# Patient Record
Sex: Female | Born: 1960 | Race: White | Hispanic: No | Marital: Married | Smoking: Never smoker
Health system: Southern US, Community
[De-identification: ages and names within clinical notes are randomized; demographics above are authoritative.]

---

## 1991-07-21 HISTORY — PX: OVARIAN CYST REMOVAL: SHX89

## 1999-04-18 ENCOUNTER — Other Ambulatory Visit: Admission: RE | Admit: 1999-04-18 | Discharge: 1999-04-18 | Payer: Self-pay | Admitting: Gynecology

## 2000-04-27 ENCOUNTER — Other Ambulatory Visit: Admission: RE | Admit: 2000-04-27 | Discharge: 2000-04-27 | Payer: Self-pay | Admitting: Gynecology

## 2004-11-11 ENCOUNTER — Ambulatory Visit: Payer: Self-pay | Admitting: Family Medicine

## 2006-01-26 ENCOUNTER — Ambulatory Visit: Payer: Self-pay | Admitting: Family Medicine

## 2007-03-15 ENCOUNTER — Ambulatory Visit: Payer: Self-pay | Admitting: Family Medicine

## 2008-05-24 ENCOUNTER — Ambulatory Visit: Payer: Self-pay | Admitting: Family Medicine

## 2009-07-25 ENCOUNTER — Ambulatory Visit: Payer: Self-pay | Admitting: Family Medicine

## 2010-09-12 LAB — HM PAP SMEAR: HM Pap smear: NORMAL

## 2010-09-30 ENCOUNTER — Inpatient Hospital Stay: Payer: Self-pay | Admitting: General Surgery

## 2010-09-30 HISTORY — PX: APPENDECTOMY: SHX54

## 2010-10-08 ENCOUNTER — Ambulatory Visit: Payer: Self-pay | Admitting: Family Medicine

## 2011-11-17 ENCOUNTER — Ambulatory Visit: Payer: Self-pay | Admitting: Family Medicine

## 2013-04-12 ENCOUNTER — Ambulatory Visit: Payer: Self-pay | Admitting: Family Medicine

## 2013-05-19 LAB — HM COLONOSCOPY

## 2013-07-06 ENCOUNTER — Ambulatory Visit: Payer: Self-pay | Admitting: Family Medicine

## 2013-07-06 LAB — HM MAMMOGRAPHY

## 2015-01-28 ENCOUNTER — Ambulatory Visit (INDEPENDENT_AMBULATORY_CARE_PROVIDER_SITE_OTHER): Payer: BLUE CROSS/BLUE SHIELD | Admitting: Family Medicine

## 2015-01-28 ENCOUNTER — Encounter: Payer: Self-pay | Admitting: Family Medicine

## 2015-01-28 VITALS — BP 102/60 | HR 84 | Temp 98.4°F | Resp 16 | Ht 63.0 in | Wt 111.0 lb

## 2015-01-28 DIAGNOSIS — N390 Urinary tract infection, site not specified: Secondary | ICD-10-CM | POA: Diagnosis not present

## 2015-01-28 DIAGNOSIS — R5383 Other fatigue: Secondary | ICD-10-CM | POA: Insufficient documentation

## 2015-01-28 DIAGNOSIS — J309 Allergic rhinitis, unspecified: Secondary | ICD-10-CM | POA: Insufficient documentation

## 2015-01-28 DIAGNOSIS — B999 Unspecified infectious disease: Secondary | ICD-10-CM | POA: Insufficient documentation

## 2015-01-28 DIAGNOSIS — B373 Candidiasis of vulva and vagina: Secondary | ICD-10-CM | POA: Diagnosis not present

## 2015-01-28 DIAGNOSIS — N12 Tubulo-interstitial nephritis, not specified as acute or chronic: Secondary | ICD-10-CM | POA: Insufficient documentation

## 2015-01-28 DIAGNOSIS — N912 Amenorrhea, unspecified: Secondary | ICD-10-CM | POA: Insufficient documentation

## 2015-01-28 DIAGNOSIS — B3731 Acute candidiasis of vulva and vagina: Secondary | ICD-10-CM

## 2015-01-28 DIAGNOSIS — K5900 Constipation, unspecified: Secondary | ICD-10-CM | POA: Insufficient documentation

## 2015-01-28 DIAGNOSIS — N951 Menopausal and female climacteric states: Secondary | ICD-10-CM | POA: Insufficient documentation

## 2015-01-28 LAB — POCT URINALYSIS DIPSTICK
Bilirubin, UA: NEGATIVE
Glucose, UA: NEGATIVE
Ketones, UA: NEGATIVE
Nitrite, UA: NEGATIVE
PH UA: 5
SPEC GRAV UA: 1.015
UROBILINOGEN UA: 0.2

## 2015-01-28 MED ORDER — FLUCONAZOLE 150 MG PO TABS: 150.0000 mg | ORAL_TABLET | Freq: Once | ORAL | Status: DC

## 2015-01-28 MED ORDER — CIPROFLOXACIN HCL 500 MG PO TABS: 500.0000 mg | ORAL_TABLET | Freq: Two times a day (BID) | ORAL | Status: DC

## 2015-01-28 NOTE — Progress Notes (Signed)
   Subjective:    Patient ID: Brandi Guzman, female    DOB: 08-27-1960, 54 y.o.   MRN: [id number]  Urinary Tract Infection  This is a recurrent (Pt did have UTI symptoms about a month ago, but did improve with OTC medications.) problem. The current episode started in the past 7 days. The problem occurs every urination. The problem has been gradually worsening. The quality of the pain is described as aching and burning. The pain is at a severity of 2/10. The pain is mild. The maximum temperature recorded prior to her arrival was more than 104 F (Pt had a temp close to 105 yesterday.). The fever has been present for less than 1 day. There is no history of pyelonephritis. Associated symptoms include chills, flank pain, frequency, hematuria (Only happened once about a month ago. ), nausea, sweats and urgency. Pertinent negatives include no possible pregnancy or vomiting. She has tried home medications for the symptoms. The treatment provided mild relief.      Review of Systems  Constitutional: Positive for fever, chills, diaphoresis, activity change, appetite change and fatigue. Negative for unexpected weight change.  Gastrointestinal: Positive for nausea. Negative for vomiting, abdominal pain, diarrhea, constipation, blood in stool, abdominal distention, anal bleeding and rectal pain.  Genitourinary: Positive for dysuria, urgency, frequency, hematuria (Only happened once about a month ago. ) and flank pain. Negative for decreased urine volume, vaginal bleeding, vaginal discharge, enuresis, difficulty urinating, genital sores, menstrual problem, pelvic pain and dyspareunia.  Musculoskeletal: Positive for back pain.  Neurological: Positive for headaches. Negative for dizziness and light-headedness.       Objective:   Physical Exam  Constitutional: She appears well-developed and well-nourished. No distress.  Cardiovascular: Normal rate.   Pulmonary/Chest: Breath sounds normal.  Abdominal: She  exhibits no distension and no mass. There is tenderness in the suprapubic area. There is no rebound, no guarding and no CVA tenderness.    BP 102/60 mmHg  Pulse 84  Temp(Src) 98.4 F (36.9 C) (Oral)  Resp 16  Ht 5\' 3"  (1.6 m)  Wt 111 lb (50.349 kg)  BMI 19.67 kg/m2      Assessment & Plan:  1. Lower urinary tract infection Send for culture.  - POCT urinalysis dipstick - Urine Culture  2. Pyelonephritis No CVA tenderness but concerning for pyelonephritis.  Will treat.  Send for culture. Call if worsens or does not improve.  Monitor for C Diff secondary to exposure.  Call if worsens or does not improve.   - ciprofloxacin (CIPRO) 500 MG tablet; Take 1 tablet (500 mg total) by mouth 2 (two) times daily.  Dispense: 20 tablet; Refill: 0 - CBC with Differential/Platelet - Comprehensive metabolic panel  3. Vaginal candida Will treat presumptively .  - fluconazole (DIFLUCAN) 150 MG tablet; Take 1 tablet (150 mg total) by mouth once.  Dispense: 1 tablet; Refill: 0  Lorie PhenixNancy Gera Inboden, MD

## 2015-01-29 ENCOUNTER — Telehealth: Payer: Self-pay

## 2015-01-29 LAB — CBC WITH DIFFERENTIAL/PLATELET
BASOS ABS: 0 10*3/uL (ref 0.0–0.2)
Basos: 0 %
EOS (ABSOLUTE): 0 10*3/uL (ref 0.0–0.4)
EOS: 0 %
HEMATOCRIT: 43.3 % (ref 34.0–46.6)
Hemoglobin: 14.6 g/dL (ref 11.1–15.9)
Immature Grans (Abs): 0 10*3/uL (ref 0.0–0.1)
Immature Granulocytes: 0 %
Lymphocytes Absolute: 2.4 10*3/uL (ref 0.7–3.1)
Lymphs: 16 %
MCH: 31.7 pg (ref 26.6–33.0)
MCHC: 33.7 g/dL (ref 31.5–35.7)
MCV: 94 fL (ref 79–97)
MONOCYTES: 10 %
MONOS ABS: 1.5 10*3/uL — AB (ref 0.1–0.9)
Neutrophils Absolute: 11.6 10*3/uL — ABNORMAL HIGH (ref 1.4–7.0)
Neutrophils: 74 %
Platelets: 181 10*3/uL (ref 150–379)
RBC: 4.6 x10E6/uL (ref 3.77–5.28)
RDW: 13.1 % (ref 12.3–15.4)
WBC: 15.6 10*3/uL — ABNORMAL HIGH (ref 3.4–10.8)

## 2015-01-29 LAB — COMPREHENSIVE METABOLIC PANEL
A/G RATIO: 1.8 (ref 1.1–2.5)
ALT: 72 IU/L — ABNORMAL HIGH (ref 0–32)
AST: 25 IU/L (ref 0–40)
Albumin: 4.2 g/dL (ref 3.5–5.5)
Alkaline Phosphatase: 132 IU/L — ABNORMAL HIGH (ref 39–117)
BUN/Creatinine Ratio: 18 (ref 9–23)
BUN: 17 mg/dL (ref 6–24)
Bilirubin Total: 0.8 mg/dL (ref 0.0–1.2)
CALCIUM: 9.6 mg/dL (ref 8.7–10.2)
CHLORIDE: 98 mmol/L (ref 97–108)
CO2: 23 mmol/L (ref 18–29)
Creatinine, Ser: 0.92 mg/dL (ref 0.57–1.00)
GFR calc Af Amer: 83 mL/min/{1.73_m2} (ref >59)
GFR, EST NON AFRICAN AMERICAN: 72 mL/min/{1.73_m2} (ref >59)
Globulin, Total: 2.4 g/dL (ref 1.5–4.5)
Glucose: 101 mg/dL — ABNORMAL HIGH (ref 65–99)
Potassium: 4.9 mmol/L (ref 3.5–5.2)
Sodium: 141 mmol/L (ref 134–144)
TOTAL PROTEIN: 6.6 g/dL (ref 6.0–8.5)

## 2015-01-29 NOTE — Telephone Encounter (Signed)
-----   Message from Lorie PhenixNancy Maloney, MD sent at 01/29/2015 10:31 AM EDT ----- White count and liver enzymes are elevated, which can happen with infection. Culture will not be back until tomorrow. Please see how patient is going and repeat labs in 2 weeks. Thanks.

## 2015-01-29 NOTE — Telephone Encounter (Signed)
LMTCB 01/29/2015  Thanks,   -Laura  

## 2015-01-30 LAB — URINE CULTURE

## 2015-01-31 ENCOUNTER — Telehealth: Payer: Self-pay

## 2015-01-31 NOTE — Telephone Encounter (Signed)
LMTCB Meira Wahba Drozdowski, CMA  

## 2015-01-31 NOTE — Telephone Encounter (Signed)
Advised pt of lab results. Pt verbally acknowledges understanding. Emily Drozdowski, CMA   

## 2015-01-31 NOTE — Telephone Encounter (Signed)
-----   Message from Lorie PhenixNancy Maloney, MD sent at 01/30/2015  5:44 PM EDT ----- Does have infection. Treated with appropriate antibiotic. Please see how patient is doing. Thanks.

## 2015-06-17 ENCOUNTER — Ambulatory Visit (INDEPENDENT_AMBULATORY_CARE_PROVIDER_SITE_OTHER): Payer: BLUE CROSS/BLUE SHIELD | Admitting: Family Medicine

## 2015-06-17 ENCOUNTER — Encounter: Payer: Self-pay | Admitting: Family Medicine

## 2015-06-17 VITALS — BP 100/64 | HR 62 | Temp 97.9°F | Resp 16 | Ht 63.0 in | Wt 113.0 lb

## 2015-06-17 DIAGNOSIS — Z0000 Encounter for general adult medical examination without abnormal findings: Secondary | ICD-10-CM | POA: Diagnosis not present

## 2015-06-17 DIAGNOSIS — Z124 Encounter for screening for malignant neoplasm of cervix: Secondary | ICD-10-CM | POA: Diagnosis not present

## 2015-06-17 DIAGNOSIS — Z1239 Encounter for other screening for malignant neoplasm of breast: Secondary | ICD-10-CM | POA: Diagnosis not present

## 2015-06-17 DIAGNOSIS — N951 Menopausal and female climacteric states: Secondary | ICD-10-CM

## 2015-06-17 DIAGNOSIS — N952 Postmenopausal atrophic vaginitis: Secondary | ICD-10-CM | POA: Diagnosis not present

## 2015-06-17 DIAGNOSIS — Z780 Asymptomatic menopausal state: Secondary | ICD-10-CM | POA: Diagnosis not present

## 2015-06-17 DIAGNOSIS — Z23 Encounter for immunization: Secondary | ICD-10-CM

## 2015-06-17 LAB — POCT URINALYSIS DIPSTICK
BILIRUBIN UA: NEGATIVE
Blood, UA: NEGATIVE
Glucose, UA: NEGATIVE
Ketones, UA: NEGATIVE
LEUKOCYTES UA: NEGATIVE
PROTEIN UA: NEGATIVE
Spec Grav, UA: 1.01
Urobilinogen, UA: 0.2
pH, UA: 7

## 2015-06-17 MED ORDER — CONJ ESTROG-MEDROXYPROGEST ACE 0.625-2.5 MG PO TABS
1.0000 | ORAL_TABLET | Freq: Every day | ORAL | Status: DC
Start: 2015-06-17 — End: 2016-03-03

## 2015-06-17 NOTE — Patient Instructions (Signed)
Please call the [facility] at [facility] to schedule this at [phone]   

## 2015-06-17 NOTE — Progress Notes (Signed)
Patient ID: Brandi Guzman, female   DOB: [birth year], 54 y.o.   MRN: [id number]        Patient: Brandi Guzman, Female    DOB: [birth year], 54 y.o.   MRN: [id number] Visit Date: 06/17/2015  Today's Provider: Lorie PhenixNancy Rawan Riendeau, MD   Chief Complaint  Patient presents with  . Annual Exam   Subjective:    Annual physical exam Brandi Guzman is a 54 y.o. female who presents today for health maintenance and complete physical. She feels well. She reports exercising 2-3 times a week. She reports she is sleeping well.  03/16/14 CPE 11/02/11 Pap-neg; HPV-neg 07/06/13 Mammo-WNL 05/19/13 Colon-normal 11/02/11 EKG  Lab Results  Component Value Date   WBC 15.6* 01/28/2015   HCT 43.3 01/28/2015   GLUCOSE 101* 01/28/2015   ALT 72* 01/28/2015   AST 25 01/28/2015   NA 141 01/28/2015   K 4.9 01/28/2015   CL 98 01/28/2015   CREATININE 0.92 01/28/2015   BUN 17 01/28/2015   CO2 23 01/28/2015  -------------------------------------------------------------------------------------------------------------------------------------------------------   Review of Systems  Constitutional: Positive for diaphoresis.  HENT: Negative.   Eyes: Negative.   Respiratory: Negative.   Cardiovascular: Negative.   Gastrointestinal: Negative.   Endocrine: Negative.   Genitourinary: Negative.   Musculoskeletal: Negative.   Skin: Negative.   Allergic/Immunologic: Negative.   Neurological: Negative.   Hematological: Negative.   Psychiatric/Behavioral: Negative.     Social History      She  reports that she has never smoked. She has never used smokeless tobacco. She reports that she drinks alcohol. She reports that she does not use illicit drugs.       Social History   Social History  . Marital Status: Married    Spouse Name: Brandi Guzman NeedleMichael  . Number of Children: 2  . Years of Education: College   Occupational History  .      Full-Time   Social History Main Topics  . Smoking status: Never Smoker   .  Smokeless tobacco: Never Used  . Alcohol Use: Yes     Comment: Occasional alcohol use  . Drug Use: No  . Sexual Activity: Not Asked   Other Topics Concern  . None   Social History Narrative    Patient Active Problem List   Diagnosis Date Noted  . Vaginal atrophy 06/17/2015  . Absence of menstruation 01/28/2015  . Constipation 01/28/2015  . 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 Fatigue 01/28/2015  . Lower urinary tract infection 01/28/2015  . Allergic rhinitis 01/28/2015  . Pyelonephritis 01/28/2015    Past Surgical History  Procedure Laterality Date  . Appendectomy  09/30/2010  . Ovarian cyst removal Left 1993    Family History        Family Status  Relation Status Death Age  . Mother Alive   . Father Deceased 1782    Recenlty past away early 2016  . Brother Alive         Her family history includes CAD in her father; COPD in her mother; Diabetes in her mother; Stroke in her mother.    No Known Allergies  Previous Medications   ASPIRIN 81 MG TABLET    Take 1 tablet by mouth daily.   MULTIPLE VITAMIN PO    Take 1 tablet by mouth daily.   PROBIOTIC PRODUCT (PROBIOTIC DAILY PO)    Take 1 capsule by mouth daily.    Patient Care Team: Brandi PhenixNancy Tiya Schrupp, MD as PCP - General (Family Medicine)  Objective:   Vitals: BP 100/64 mmHg  Pulse 62  Temp(Src) 97.9 F (36.6 C) (Oral)  Resp 16  Ht  (1.6 m)  Wt 113 lb (51.256 kg)  BMI 20.02 kg/m2  SpO2 99%   Physical Exam  Constitutional: She is oriented to person, place, and time. She appears well-developed and well-nourished.  HENT:  Head: Normocephalic and atraumatic.  Right Ear: Tympanic membrane, external ear and ear canal normal.  Left Ear: Tympanic membrane, external ear and ear canal normal.  Nose: Nose normal.  Mouth/Throat: Uvula is midline, oropharynx is clear and moist and mucous membranes are normal.  Eyes: Conjunctivae, EOM and lids are normal. Pupils are equal, round, and reactive to light.    Neck: Trachea normal and normal range of motion. Neck supple. Carotid bruit is not present. No thyroid mass and no thyromegaly present.  Cardiovascular: Normal rate, regular rhythm and normal heart sounds.   Pulmonary/Chest: Effort normal and breath sounds normal.  Abdominal: Soft. Normal appearance and bowel sounds are normal. There is no hepatosplenomegaly. There is no tenderness.  Genitourinary: No breast swelling, tenderness or discharge.  Musculoskeletal: Normal range of motion.  Lymphadenopathy:    She has no cervical adenopathy.    She has no axillary adenopathy.  Neurological: She is alert and oriented to person, place, and time. She has normal strength. No cranial nerve deficit.  Skin: Skin is warm, dry and intact.  Psychiatric: She has a normal mood and affect. Her speech is normal and behavior is normal. Judgment and thought content normal. Cognition and memory are normal.     Depression Screen PHQ 2/9 Scores 06/17/2015  Exception Documentation Patient refusal      Assessment & Plan:     Routine Health Maintenance and Physical Exam  Exercise Activities and Dietary recommendations Goals    . Exercise 150 minutes per week (moderate activity)       Immunization History  Administered Date(s) Administered  . Td 05/29/2002  . Tdap 11/02/2011  . Zoster 07/06/2014       1. Annual physical exam Stable. Patient advised to continue eating healthy and exercise daily. - POCT urinalysis dipstick - CBC with Differential/Platelet - Comprehensive metabolic panel - Lipid Panel With LDL/HDL Ratio  2. Breast cancer screening - MM DIGITAL SCREENING BILATERAL; Future  3. Need for influenza vaccination - Flu Vaccine QUAD 36+ mos IM  4. Cervical cancer screening - Pap IG and HPV (high risk) DNA detection  5. Post menopausal syndrome Worsening.  6. Vaginal atrophy Worsening. Patient started on Prempro 0.625-2.5 mg as below. Discussed risks and benefits of medication  and will proceed.  - estrogen, conjugated,-medroxyprogesterone (PREMPRO) 0.625-2.5 MG tablet; Take 1 tablet by mouth daily.  Dispense: 30 tablet; Refill: 5    Patient seen and examined by Dr. Leo Grosser, and note scribed by Liz Beach. Dimas, CMA.  I have reviewed the document for accuracy and completeness and I agree with above. Leo Grosser, MD   Brandi Phenix, MD

## 2015-06-18 ENCOUNTER — Telehealth: Payer: Self-pay

## 2015-06-18 DIAGNOSIS — E875 Hyperkalemia: Secondary | ICD-10-CM

## 2015-06-18 LAB — COMPREHENSIVE METABOLIC PANEL
ALK PHOS: 103 IU/L (ref 39–117)
ALT: 46 IU/L — AB (ref 0–32)
AST: 14 IU/L (ref 0–40)
Albumin/Globulin Ratio: 2 (ref 1.1–2.5)
Albumin: 4.5 g/dL (ref 3.5–5.5)
BUN/Creatinine Ratio: 14 (ref 9–23)
BUN: 11 mg/dL (ref 6–24)
Bilirubin Total: 0.3 mg/dL (ref 0.0–1.2)
CALCIUM: 10.4 mg/dL — AB (ref 8.7–10.2)
CO2: 25 mmol/L (ref 18–29)
CREATININE: 0.78 mg/dL (ref 0.57–1.00)
Chloride: 102 mmol/L (ref 97–106)
GFR calc Af Amer: 101 mL/min/{1.73_m2} (ref >59)
GFR calc non Af Amer: 88 mL/min/{1.73_m2} (ref >59)
GLUCOSE: 94 mg/dL (ref 65–99)
Globulin, Total: 2.2 g/dL (ref 1.5–4.5)
Potassium: 5.5 mmol/L — ABNORMAL HIGH (ref 3.5–5.2)
SODIUM: 146 mmol/L — AB (ref 136–144)
Total Protein: 6.7 g/dL (ref 6.0–8.5)

## 2015-06-18 LAB — LIPID PANEL WITH LDL/HDL RATIO
CHOLESTEROL TOTAL: 204 mg/dL — AB (ref 100–199)
HDL: 82 mg/dL (ref >39)
LDL CALC: 110 mg/dL — AB (ref 0–99)
LDl/HDL Ratio: 1.3 ratio units (ref 0.0–3.2)
TRIGLYCERIDES: 60 mg/dL (ref 0–149)
VLDL CHOLESTEROL CAL: 12 mg/dL (ref 5–40)

## 2015-06-18 LAB — CBC WITH DIFFERENTIAL/PLATELET
BASOS ABS: 0 10*3/uL (ref 0.0–0.2)
Basos: 0 %
EOS (ABSOLUTE): 0.1 10*3/uL (ref 0.0–0.4)
Eos: 2 %
Hematocrit: 43.4 % (ref 34.0–46.6)
Hemoglobin: 14.7 g/dL (ref 11.1–15.9)
IMMATURE GRANS (ABS): 0 10*3/uL (ref 0.0–0.1)
Immature Granulocytes: 0 %
Lymphocytes Absolute: 2.5 10*3/uL (ref 0.7–3.1)
Lymphs: 33 %
MCH: 31.1 pg (ref 26.6–33.0)
MCHC: 33.9 g/dL (ref 31.5–35.7)
MCV: 92 fL (ref 79–97)
MONOS ABS: 0.5 10*3/uL (ref 0.1–0.9)
Monocytes: 7 %
NEUTROS ABS: 4.2 10*3/uL (ref 1.4–7.0)
Neutrophils: 58 %
PLATELETS: 197 10*3/uL (ref 150–379)
RBC: 4.72 x10E6/uL (ref 3.77–5.28)
RDW: 13.2 % (ref 12.3–15.4)
WBC: 7.4 10*3/uL (ref 3.4–10.8)

## 2015-06-18 NOTE — Telephone Encounter (Signed)
Pt advised.  I left a lab sheet at the front desk for her to pick up.   Thanks,   -Vernona RiegerLaura

## 2015-06-18 NOTE — Telephone Encounter (Signed)
-----   Message from Lorie PhenixNancy Maloney, MD sent at 06/18/2015  1:15 PM EST ----- Labs  Stable except another high potassium  Recommend recheck with no charge per Lorel MonacoMark Cheek and leave note copy for Sarah. Thanks.

## 2015-06-22 LAB — COMPREHENSIVE METABOLIC PANEL
ALK PHOS: 106 IU/L (ref 39–117)
ALT: 33 IU/L — ABNORMAL HIGH (ref 0–32)
AST: 13 IU/L (ref 0–40)
Albumin/Globulin Ratio: 1.9 (ref 1.1–2.5)
Albumin: 4.2 g/dL (ref 3.5–5.5)
BUN/Creatinine Ratio: 23 (ref 9–23)
BUN: 17 mg/dL (ref 6–24)
Bilirubin Total: 0.3 mg/dL (ref 0.0–1.2)
CHLORIDE: 97 mmol/L (ref 97–106)
CO2: 26 mmol/L (ref 18–29)
CREATININE: 0.75 mg/dL (ref 0.57–1.00)
Calcium: 10 mg/dL (ref 8.7–10.2)
GFR calc Af Amer: 106 mL/min/{1.73_m2} (ref >59)
GFR calc non Af Amer: 92 mL/min/{1.73_m2} (ref >59)
GLUCOSE: 79 mg/dL (ref 65–99)
Globulin, Total: 2.2 g/dL (ref 1.5–4.5)
Potassium: 4.9 mmol/L (ref 3.5–5.2)
Sodium: 137 mmol/L (ref 136–144)
Total Protein: 6.4 g/dL (ref 6.0–8.5)

## 2015-06-22 LAB — PAP IG AND HPV HIGH-RISK
HPV, high-risk: NEGATIVE
PAP SMEAR COMMENT: 0

## 2015-06-24 ENCOUNTER — Telehealth: Payer: Self-pay

## 2015-06-24 NOTE — Telephone Encounter (Signed)
Advised pt of lab results. Pt verbally acknowledges understanding. [place] Marchiano Drozdowski, CMA   

## 2015-06-24 NOTE — Telephone Encounter (Signed)
Advised pt of lab results. Pt verbally acknowledges understanding. Emily Drozdowski, CMA   

## 2015-06-24 NOTE — Telephone Encounter (Signed)
-----   Message from Lorie PhenixNancy Maloney, MD sent at 06/22/2015  7:59 AM EST ----- Labs stable. Please notify patient. Thanks

## 2015-06-24 NOTE — Telephone Encounter (Signed)
-----   Message from Nancy Maloney, MD sent at 06/23/2015  7:34 AM EST ----- Pap is normal. Please notify patient. 

## 2015-07-03 ENCOUNTER — Ambulatory Visit (INDEPENDENT_AMBULATORY_CARE_PROVIDER_SITE_OTHER): Payer: BLUE CROSS/BLUE SHIELD | Admitting: Family Medicine

## 2015-07-03 ENCOUNTER — Encounter: Payer: Self-pay | Admitting: Family Medicine

## 2015-07-03 VITALS — BP 110/70 | HR 61 | Temp 98.3°F | Resp 16 | Wt 115.0 lb

## 2015-07-03 DIAGNOSIS — J029 Acute pharyngitis, unspecified: Secondary | ICD-10-CM | POA: Diagnosis not present

## 2015-07-03 DIAGNOSIS — J069 Acute upper respiratory infection, unspecified: Secondary | ICD-10-CM

## 2015-07-03 LAB — POC INFLUENZA A&B (BINAX/QUICKVUE)
INFLUENZA A, POC: NEGATIVE
INFLUENZA B, POC: NEGATIVE

## 2015-07-03 LAB — POCT RAPID STREP A (OFFICE): Rapid Strep A Screen: NEGATIVE

## 2015-07-03 NOTE — Patient Instructions (Addendum)
   Gargle with mild warm salt water solution every 2 hours   Take OTC Zinc lozenges (Col-eeze) per directions on label    OTC ibuprofen (Advil) per directions on label for headache and throat pain

## 2015-07-03 NOTE — Progress Notes (Signed)
Patient: Brandi Guzman Female    DOB: [birth year]   54 y.o.   MRN: [id number] Visit Date: 07/03/2015  Today's Provider: Mila Merry, MD   Chief Complaint  Patient presents with  . Sore Throat    x 2 days   Subjective:    Sore Throat  This is a new problem. Episode onset: started 2 days ago. The problem has been unchanged. Neither side of throat is experiencing more pain than the other. There has been no fever. Associated symptoms include headaches (started today), a hoarse voice, a plugged ear sensation and trouble swallowing (painful swallowing). Pertinent negatives include no abdominal pain, congestion, coughing, diarrhea, drooling, ear discharge, ear pain, neck pain, shortness of breath, swollen glands or vomiting. She has had exposure to strep. Exposure to: Cabin [employer]. Treatments tried: Sudafed and Excedrin Migraine. The treatment provided mild relief.  He has also had chills, achiness and bifrontal headache. She has been taking sudafed because she though her sore throat is due to sinus drainage.      No Known Allergies Previous Medications   ASPIRIN 81 MG TABLET    Take 1 tablet by mouth daily.   ESTROGEN, CONJUGATED,-MEDROXYPROGESTERONE (PREMPRO) 0.625-2.5 MG TABLET    Take 1 tablet by mouth daily.   MULTIPLE VITAMIN PO    Take 1 tablet by mouth daily.   PROBIOTIC PRODUCT (PROBIOTIC DAILY PO)    Take 1 capsule by mouth daily.    Review of Systems  Constitutional: Positive for fatigue. Negative for fever, chills, diaphoresis and appetite change.  HENT: Positive for hoarse voice, sneezing, sore throat and trouble swallowing (painful swallowing). Negative for congestion, drooling, ear discharge, ear pain and rhinorrhea.   Eyes: Negative for photophobia, pain, discharge, redness, itching and visual disturbance.  Respiratory: Negative for cough, chest tightness and shortness of breath.   Cardiovascular: Negative for chest pain and palpitations.  Gastrointestinal: Negative  for nausea, vomiting, abdominal pain and diarrhea.  Musculoskeletal: Negative for neck pain.  Neurological: Positive for headaches (started today). Negative for dizziness and weakness.    Social History  Substance Use Topics  . Smoking status: Never Smoker   . Smokeless tobacco: Never Used  . Alcohol Use: Yes     Comment: Occasional alcohol use   Objective:   BP 110/70 mmHg  Pulse 61  Temp(Src) 98.3 F (36.8 C) (Oral)  Resp 16  Wt 115 lb (52.164 kg)  SpO2 98%  Physical Exam  General Appearance:    Alert, cooperative, no distress  HENT:   bilateral TM normal without fluid or infection, neck has bilateral anterior cervical nodes enlarged, pharynx erythematous without exudate, sinuses nontender, post nasal drip noted and nasal mucosa pale and congested  Eyes:    PERRL, conjunctiva/corneas clear, EOM's intact       Lungs:     Clear to auscultation bilaterally, respirations unlabored  Heart:    Regular rate and rhythm  Neurologic:   Awake, alert, oriented x 3. No apparent focal neurological           defect.       Results for orders placed or performed in visit on 07/03/15  POC Influenza A&B  Result Value Ref Range   Influenza A, POC Negative Negative   Influenza B, POC Negative Negative  POCT rapid strep A  Result Value Ref Range   Rapid Strep A Screen Negative Negative        Assessment & Plan:     1.  Sore throat  - POC Influenza A&B - POCT rapid strep A  2. URI (upper respiratory infection) Patient counseled extensively on distinction between viral and bacterial respiratory infections. Advised to call or return for additional evaluation if she develops any sign of bacterial infection, or if current symptoms last longer than 10 days.   Over half of this 20 minute visit were spent in counseling and coordinating care of multiple medical problems.  Patient Instructions   Gargle with mild warm salt water solution every 2 hours   Take OTC Zinc lozenges (Col-eeze)  per directions on label    OTC ibuprofen (Advil) per directions on label for headache and throat pain         Mila Merryonald Linsy Ehresman, MD  [facility]

## 2016-03-02 ENCOUNTER — Encounter: Payer: Self-pay | Admitting: Physician Assistant

## 2016-03-02 ENCOUNTER — Ambulatory Visit (INDEPENDENT_AMBULATORY_CARE_PROVIDER_SITE_OTHER): Payer: BLUE CROSS/BLUE SHIELD | Admitting: Physician Assistant

## 2016-03-02 VITALS — BP 102/70 | HR 60 | Temp 98.2°F | Resp 16 | Wt 110.8 lb

## 2016-03-02 DIAGNOSIS — R5383 Other fatigue: Secondary | ICD-10-CM

## 2016-03-02 DIAGNOSIS — L659 Nonscarring hair loss, unspecified: Secondary | ICD-10-CM | POA: Diagnosis not present

## 2016-03-02 NOTE — Progress Notes (Addendum)
Patient: Brandi Guzman Female    DOB: 01-Jul-1961   55 y.o.   MRN: [id number] Visit Date: 03/02/2016  Today's Provider: Margaretann LovelessJennifer M Burnette, PA-C   Chief Complaint  Patient presents with  . Alopecia   Subjective:    HPI Patient is here with c/o of hair loss since February 2017. She is on Prempo due to previous menopausal symptoms a couple of years back. Symptoms at that time included hair loss, vaginal atrophy/dryness and hot flashes. She reports she being taking Biotin since then as well. When she started on the prempro a couple of years back all symptoms improved. Then beginning in February she has noticed increased hair loss again. All other menopausal symptoms are still controlled. She is now having significant stress about the hair loss which is causing the hair loss to increase. She is wanting labs.     No Known Allergies Current Meds  Medication Sig  . BIOTIN PO Take by mouth.  . estrogen, conjugated,-medroxyprogesterone (PREMPRO) 0.625-2.5 MG tablet Take 1 tablet by mouth daily.  . MULTIPLE VITAMIN PO Take 1 tablet by mouth daily.  . Probiotic Product (PROBIOTIC DAILY PO) Take 1 capsule by mouth daily.    Review of Systems  Constitutional: Positive for fatigue. Negative for chills and fever.  HENT:       Hair loss  Respiratory: Negative for chest tightness and shortness of breath.   Cardiovascular: Negative for chest pain, palpitations and leg swelling.  Gastrointestinal: Negative for abdominal pain, constipation, diarrhea and nausea.  Skin: Negative for rash.  Allergic/Immunologic: Negative for environmental allergies, food allergies and immunocompromised state.  Neurological: Negative for dizziness, weakness and headaches.  Psychiatric/Behavioral: Negative for agitation, dysphoric mood and sleep disturbance. The patient is not nervous/anxious.     Social History  Substance Use Topics  . Smoking status: Never Smoker  . Smokeless tobacco: Never Used  .  Alcohol use Yes     Comment: Occasional alcohol use   Objective:   BP 102/70 (BP Location: Right Arm, Patient Position: Sitting, Cuff Size: Normal)   Pulse 60   Temp 98.2 F (36.8 C) (Oral)   Resp 16   Wt 110 lb 12.8 oz (50.3 kg)   BMI 19.63 kg/m   Physical Exam  Constitutional: She appears well-developed and well-nourished. No distress.  HENT:  Head: Normocephalic and atraumatic. Hair is normal (no patches of hair loss noted. No rash on scalp; did have one strand on clothes that did have root bulb; not much breakage).  Neck: Normal range of motion. Neck supple. No JVD present. No tracheal deviation present. No thyromegaly present.  Cardiovascular: Normal rate, regular rhythm and normal heart sounds.  Exam reveals no gallop and no friction rub.   No murmur heard. Pulmonary/Chest: Effort normal and breath sounds normal. No respiratory distress. She has no wheezes. She has no rales.  Lymphadenopathy:    She has no cervical adenopathy.  Skin: She is not diaphoretic.  Vitals reviewed.     Assessment & Plan:     1. Hair loss Unknown source at this time. Worsened by stress. Will check labs as below and f/u pending lab results. If labs are stable will do trial increase of prempro to see if that helps decrease hair loss. Will see her back in a couple months for her CPE. She is to call if symptoms worsen in the meantime. - CBC with Differential - TSH - Vitamin D (25 hydroxy) - B12 - FSH -  Estradiol - Magnesium - Comprehensive Metabolic Panel (CMET) - Iron - Iron Binding Cap (TIBC)  2. Other fatigue See above medical treatment plan. - CBC with Differential - TSH - Vitamin D (25 hydroxy) - B12 - FSH - Estradiol - Magnesium - Comprehensive Metabolic Panel (CMET) - Iron - Iron Binding Cap (TIBC)  Addend: Labs were WNL. Will increase prempro to 0.625-5mg .      Margaretann LovelessJennifer M Burnette, PA-C  [facility]

## 2016-03-03 LAB — FOLLICLE STIMULATING HORMONE: FSH: 27.4 m[IU]/mL

## 2016-03-03 LAB — IRON AND TIBC
Iron Saturation: 43 % (ref 15–55)
Iron: 135 ug/dL (ref 27–159)
Total Iron Binding Capacity: 317 ug/dL (ref 250–450)
UIBC: 182 ug/dL (ref 131–425)

## 2016-03-03 LAB — COMPREHENSIVE METABOLIC PANEL
A/G RATIO: 1.8 (ref 1.2–2.2)
ALT: 24 IU/L (ref 0–32)
AST: 11 IU/L (ref 0–40)
Albumin: 4.1 g/dL (ref 3.5–5.5)
Alkaline Phosphatase: 60 IU/L (ref 39–117)
BUN / CREAT RATIO: 17 (ref 9–23)
BUN: 14 mg/dL (ref 6–24)
Bilirubin Total: 0.5 mg/dL (ref 0.0–1.2)
CALCIUM: 9.5 mg/dL (ref 8.7–10.2)
CO2: 22 mmol/L (ref 18–29)
Chloride: 100 mmol/L (ref 96–106)
Creatinine, Ser: 0.82 mg/dL (ref 0.57–1.00)
GFR, EST AFRICAN AMERICAN: 94 mL/min/{1.73_m2} (ref >59)
GFR, EST NON AFRICAN AMERICAN: 82 mL/min/{1.73_m2} (ref >59)
GLOBULIN, TOTAL: 2.3 g/dL (ref 1.5–4.5)
Glucose: 80 mg/dL (ref 65–99)
POTASSIUM: 4.2 mmol/L (ref 3.5–5.2)
SODIUM: 139 mmol/L (ref 134–144)
TOTAL PROTEIN: 6.4 g/dL (ref 6.0–8.5)

## 2016-03-03 LAB — CBC WITH DIFFERENTIAL/PLATELET
Basophils Absolute: 0 x10E3/uL (ref 0.0–0.2)
Basos: 0 %
EOS (ABSOLUTE): 0.1 x10E3/uL (ref 0.0–0.4)
Eos: 2 %
Hematocrit: 45.8 % (ref 34.0–46.6)
Hemoglobin: 14.4 g/dL (ref 11.1–15.9)
Immature Grans (Abs): 0 x10E3/uL (ref 0.0–0.1)
Immature Granulocytes: 0 %
Lymphocytes Absolute: 2.1 x10E3/uL (ref 0.7–3.1)
Lymphs: 35 %
MCH: 29.8 pg (ref 26.6–33.0)
MCHC: 31.4 g/dL — ABNORMAL LOW (ref 31.5–35.7)
MCV: 95 fL (ref 79–97)
Monocytes Absolute: 0.4 x10E3/uL (ref 0.1–0.9)
Monocytes: 7 %
Neutrophils Absolute: 3.5 x10E3/uL (ref 1.4–7.0)
Neutrophils: 56 %
Platelets: 189 x10E3/uL (ref 150–379)
RBC: 4.84 x10E6/uL (ref 3.77–5.28)
RDW: 13.1 % (ref 12.3–15.4)
WBC: 6.1 x10E3/uL (ref 3.4–10.8)

## 2016-03-03 LAB — TSH: TSH: 1.42 u[IU]/mL (ref 0.450–4.500)

## 2016-03-03 LAB — MAGNESIUM: MAGNESIUM: 2 mg/dL (ref 1.6–2.3)

## 2016-03-03 LAB — ESTRADIOL: Estradiol: 50.9 pg/mL

## 2016-03-03 LAB — VITAMIN D 25 HYDROXY (VIT D DEFICIENCY, FRACTURES): VIT D 25 HYDROXY: 46.2 ng/mL (ref 30.0–100.0)

## 2016-03-03 LAB — VITAMIN B12: VITAMIN B 12: 540 pg/mL (ref 211–946)

## 2016-03-03 MED ORDER — CONJ ESTROG-MEDROXYPROGEST ACE 0.625-5 MG PO TABS: 1.0000 | ORAL_TABLET | Freq: Every day | ORAL | 1 refills | Status: DC

## 2016-03-03 NOTE — Addendum Note (Signed)
Addended by: Margaretann LovelessBURNETTE, Deoni Cosey M on: 03/03/2016 09:00 AM   Modules accepted: Orders

## 2016-03-13 ENCOUNTER — Ambulatory Visit (INDEPENDENT_AMBULATORY_CARE_PROVIDER_SITE_OTHER): Payer: BLUE CROSS/BLUE SHIELD | Admitting: Podiatry

## 2016-03-13 ENCOUNTER — Encounter: Payer: Self-pay | Admitting: Podiatry

## 2016-03-13 ENCOUNTER — Ambulatory Visit (INDEPENDENT_AMBULATORY_CARE_PROVIDER_SITE_OTHER): Payer: BLUE CROSS/BLUE SHIELD

## 2016-03-13 VITALS — BP 145/77 | HR 54 | Resp 16 | Ht 64.0 in | Wt 110.0 lb

## 2016-03-13 DIAGNOSIS — M21619 Bunion of unspecified foot: Secondary | ICD-10-CM | POA: Diagnosis not present

## 2016-03-13 DIAGNOSIS — M79671 Pain in right foot: Secondary | ICD-10-CM

## 2016-03-13 DIAGNOSIS — M79672 Pain in left foot: Secondary | ICD-10-CM

## 2016-03-13 NOTE — Patient Instructions (Signed)
Bunionectomy A bunionectomy is a surgical procedure to remove a bunion. A bunion is a visible bump of bone on the inside of your foot where your big toe meets the rest of your foot. A bunion can develop when pressure turns this bone (first metatarsal) toward the other toes. Shoes that are too tight are the most common cause of bunions. Bunions can also be caused by diseases, such as arthritis and polio. You may need a bunionectomy if your bunion is very large and painful or it affects your ability to walk. LET YOUR HEALTH CARE PROVIDER KNOW ABOUT:  Any allergies you have.  All medicines you are taking, including vitamins, herbs, eye drops, creams, and over-the-counter medicines.  Previous problems you or members of your family have had with the use of anesthetics.  Any blood disorders you have.  Previous surgeries you have had.  Medical conditions you have. RISKS AND COMPLICATIONS  Generally, this is a safe procedure. However, problems may occur, including:  Infection.  Pain.  Nerve damage.  Bleeding or blood clots.  Reactions to medicines.  Numbness, stiffness, or arthritis in your toe.  Foot problems that continue even after the procedure. BEFORE THE PROCEDURE  Ask your health care provider about:  Changing or stopping your regular medicines. This is especially important if you are taking diabetes medicines or blood thinners.  Taking medicines such as aspirin and ibuprofen. These medicines can thin your blood. Do not take these medicines before your procedure if your health care provider instructs you not to.  Do not drink alcohol before the procedure as directed by your health care provider.  Do not use tobacco products, including cigarettes, chewing tobacco, or electronic cigarettes, before the procedure as directed by your health care provider. If you need help quitting, ask your health care provider.  Ask your health care provider what kind of medicine you will be  given during your procedure. A bunionectomy may be done using one of these:  A medicine that numbs the area (local anesthetic).  A medicine that makes you go to sleep (general anesthetic). If you will be given general anesthetic, do not eat or drink anything after midnight on the night before the procedure or as directed by your health care provider. PROCEDURE  An IV tube may be inserted into a vein.  You will be given local anesthetic or general anesthetic.  The surgeon will make a cut (incision) over the enlarged area at the first joint of the big toe. The surgeon will remove the bunion.  You may have more than one incision if any of the bones in your big toe need to be moved. A bone itself may need to be cut.  Sometimes the tissues around the big toe may also need to be cut then tightened or loosened to reposition the toe.  Screws or other hardware may be used to keep your foot in thecorrect position.  The incision will be closed with stitches (sutures) and covered with adhesive strips or another type of bandage (dressing). AFTER THE PROCEDURE  You may spend some time in a recovery area.  Your blood pressure, heart rate, breathing rate, and blood oxygen level will be monitored often until the medicines you were given have worn off.   This information is not intended to replace advice given to you by your health care provider. Make sure you discuss any questions you have with your health care provider.   Document Released: 06/19/2005 Document Revised: 03/27/2015 Document Reviewed: 02/21/2014   Elsevier Interactive Patient Education 2016 [employer]  

## 2016-03-13 NOTE — Progress Notes (Signed)
   Subjective:    Patient ID: Brandi Guzman, female    DOB: 1961/04/01, 55 y.o.   MRN: [id number]  HPI Chief Complaint  Patient presents with  . Foot Pain    Bilateral; bunions; pt stated, "Bunions do not hurt, but when does cardio workout, bunions start to hurt then; wants to discuss surgery"      Review of Systems  All other systems reviewed and are negative.      Objective:   Physical Exam        Assessment & Plan:

## 2016-03-13 NOTE — Progress Notes (Signed)
Subjective:     Patient ID: Brandi Guzman, female   DOB: 08-25-1960, 55 y.o.   MRN: [id number]  HPI patient presents with significant structural bunion deformity right over left with redness and pain when palpated and states that she's been utilizing wider shoes softer leather but gradually becoming more difficult to accommodate   Review of Systems  All other systems reviewed and are negative.      Objective:   Physical Exam  Constitutional: She is oriented to person, place, and time.  Cardiovascular: Intact distal pulses.   Musculoskeletal: Normal range of motion.  Neurological: She is oriented to person, place, and time.  Skin: Skin is warm.  Nursing note and vitals reviewed.  neurovascular status intact muscle strength adequate range of motion within normal limits with patient noted to have a large hyperostosis medial aspect first metatarsal head right over left with redness and pain when palpated. There is deviation of the right hallux against second toe with mild deviation of the left with no where near as much deformity on the left. 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 I did discuss distal osteotomy and reviewed that with her and she would like to get this done but is not sure on scheduling time. Reviewed her x-rays and she will call to schedule when it is appropriate for her and we'll see her back prior to procedure  X-ray report indicated elevation of the 1 to intermetatarsal angle right with mild deviation of the right hallux against the second toe and minimal deformity on the left

## 2016-03-27 ENCOUNTER — Telehealth: Payer: Self-pay | Admitting: *Deleted

## 2016-03-27 NOTE — Telephone Encounter (Signed)
"  I'd like to see what dates Dr. Charlsie Merlesegal has available for surgery."  Have you signed consent forms?  "No, I just want to get some dates."  Do you have a date in mind that you would like to schedule?  "I'm looking at January 2nd, 2018."  That date is available.  I can put you down tentatively.  Would you like to schedule now for a consultation with Dr. Charlsie Merlesegal?  "Yes, that would be great."  I transferred patient to a scheduler.

## 2016-04-10 ENCOUNTER — Ambulatory Visit (INDEPENDENT_AMBULATORY_CARE_PROVIDER_SITE_OTHER): Payer: BLUE CROSS/BLUE SHIELD | Admitting: Podiatry

## 2016-04-10 DIAGNOSIS — M21619 Bunion of unspecified foot: Secondary | ICD-10-CM

## 2016-04-10 NOTE — Patient Instructions (Signed)
Pre-Operative Instructions  Congratulations, you have decided to take an important step to improving your quality of life.  You can be assured that the doctors of [facility] will be with you every step of the way.  1. Plan to be at the surgery center/hospital at least 1 (one) hour prior to your scheduled time unless otherwise directed by the surgical center/hospital staff.  You must have a responsible adult accompany you, remain during the surgery and drive you home.  Make sure you have directions to the surgical center/hospital and know how to get there on time. 2. For hospital based surgery you will need to obtain a history and physical form from your family physician within 1 month prior to the date of surgery- we will give you a form for you primary physician.  3. We make every effort to accommodate the date you request for surgery.  There are however, times where surgery dates or times have to be moved.  We will contact you as soon as possible if a change in schedule is required.   4. No Aspirin/Ibuprofen for one week before surgery.  If you are on aspirin, any non-steroidal anti-inflammatory medications (Mobic, Aleve, Ibuprofen) you should stop taking it 7 days prior to your surgery.  You make take Tylenol  For pain prior to surgery.  5. Medications- If you are taking daily heart and blood pressure medications, seizure, reflux, allergy, asthma, anxiety, pain or diabetes medications, make sure the surgery center/hospital is aware before the day of surgery so they may notify you which medications to take or avoid the day of surgery. 6. No food or drink after midnight the night before surgery unless directed otherwise by surgical center/hospital staff. 7. No alcoholic beverages 24 hours prior to surgery.  No smoking 24 hours prior to or 24 hours after surgery. 8. Wear loose pants or shorts- loose enough to fit over bandages, boots, and casts. 9. No slip on shoes, sneakers are best. 10. Bring  your boot with you to the surgery center/hospital.  Also bring crutches or a walker if your physician has prescribed it for you.  If you do not have this equipment, it will be provided for you after surgery. 11. If you have not been contracted by the surgery center/hospital by the day before your surgery, call to confirm the date and time of your surgery. 12. Leave-time from work may vary depending on the type of surgery you have.  Appropriate arrangements should be made prior to surgery with your employer. 13. Prescriptions will be provided immediately following surgery by your doctor.  Have these filled as soon as possible after surgery and take the medication as directed. 14. Remove nail polish on the operative foot. 15. Wash the night before surgery.  The night before surgery wash the foot and leg well with the antibacterial soap provided and water paying special attention to beneath the toenails and in between the toes.  Rinse thoroughly with water and dry well with a towel.  Perform this wash unless told not to do so by your physician.  Enclosed: 1 Ice pack (please put in freezer the night before surgery)   1 Hibiclens skin cleaner   Pre-op Instructions  If you have any questions regarding the instructions, do not hesitate to call our office.  [place]: [address] [place], [place] [address] [phone]  [place]: [address] , [place] [address] [phone]  [place]: [address]  [place], [place] [address] [phone]   Dr.   Williard Keller DPM, Dr. Matthew Wagoner DPM, Dr. M. Todd Hyatt DPM, Dr. Titorya Stover DPM 

## 2016-04-13 NOTE — Progress Notes (Signed)
Subjective:     Patient ID: Brandi Guzman, female   DOB: July 24, 1960, 55 y.o.   MRN: [id number]  HPI patient presents with significant structural deformity of the right first metatarsal and right hallux over the left with deviation noted and pain and redness upon palpation   Review of Systems     Objective:   Physical Exam Neurovascular status intact negative Homans sign was noted with patient found have structural malalignment first metatarsal head right over left with deviation the hallux right over left redness pain and inflammation is noted    Assessment:     Structural deformity right over left foot with deviation of the hallux against the second toe    Plan:     H&P and condition reviewed with patient along with x-rays. At this point I have recommended surgical intervention in this particular case and did discuss structural correction with possibility also for Akin-type osteotomy. Patient wants surgery and after extensive review signs consent form and is given all instructions for surgical intervention. She understands no guarantee as far successful surgery and she understands alternative treatments complications as listed and that recovery can take 6 months to one year for complete full recovery. I did dispense air fracture walker with all instructions on usage and she'll be seen back for surgical intervention and is encouraged to call with questions

## 2016-05-13 ENCOUNTER — Other Ambulatory Visit: Payer: Self-pay | Admitting: Physician Assistant

## 2016-05-13 DIAGNOSIS — R5383 Other fatigue: Secondary | ICD-10-CM

## 2016-05-13 DIAGNOSIS — L659 Nonscarring hair loss, unspecified: Secondary | ICD-10-CM

## 2016-06-19 ENCOUNTER — Encounter: Payer: Self-pay | Admitting: Physician Assistant

## 2016-06-19 ENCOUNTER — Ambulatory Visit (INDEPENDENT_AMBULATORY_CARE_PROVIDER_SITE_OTHER): Payer: BLUE CROSS/BLUE SHIELD | Admitting: Physician Assistant

## 2016-06-19 VITALS — BP 118/70 | HR 62 | Temp 97.9°F | Resp 16 | Ht 64.0 in | Wt 118.2 lb

## 2016-06-19 DIAGNOSIS — Z23 Encounter for immunization: Secondary | ICD-10-CM | POA: Diagnosis not present

## 2016-06-19 DIAGNOSIS — Z1239 Encounter for other screening for malignant neoplasm of breast: Secondary | ICD-10-CM

## 2016-06-19 DIAGNOSIS — Z1231 Encounter for screening mammogram for malignant neoplasm of breast: Secondary | ICD-10-CM

## 2016-06-19 DIAGNOSIS — R6889 Other general symptoms and signs: Secondary | ICD-10-CM

## 2016-06-19 DIAGNOSIS — T3695XA Adverse effect of unspecified systemic antibiotic, initial encounter: Secondary | ICD-10-CM

## 2016-06-19 DIAGNOSIS — R5383 Other fatigue: Secondary | ICD-10-CM

## 2016-06-19 DIAGNOSIS — Z1159 Encounter for screening for other viral diseases: Secondary | ICD-10-CM | POA: Diagnosis not present

## 2016-06-19 DIAGNOSIS — L659 Nonscarring hair loss, unspecified: Secondary | ICD-10-CM

## 2016-06-19 DIAGNOSIS — B379 Candidiasis, unspecified: Secondary | ICD-10-CM | POA: Diagnosis not present

## 2016-06-19 DIAGNOSIS — Z0000 Encounter for general adult medical examination without abnormal findings: Secondary | ICD-10-CM | POA: Diagnosis not present

## 2016-06-19 DIAGNOSIS — J069 Acute upper respiratory infection, unspecified: Secondary | ICD-10-CM | POA: Diagnosis not present

## 2016-06-19 MED ORDER — OSELTAMIVIR PHOSPHATE 75 MG PO CAPS: 75.0000 mg | ORAL_CAPSULE | Freq: Two times a day (BID) | ORAL | 0 refills | Status: DC

## 2016-06-19 MED ORDER — CONJ ESTROG-MEDROXYPROGEST ACE 0.625-5 MG PO TABS: 1.0000 | ORAL_TABLET | Freq: Every day | ORAL | 3 refills | Status: DC

## 2016-06-19 MED ORDER — CEFDINIR 300 MG PO CAPS: 300.0000 mg | ORAL_CAPSULE | Freq: Two times a day (BID) | ORAL | 0 refills | Status: DC

## 2016-06-19 MED ORDER — FLUCONAZOLE 150 MG PO TABS: 150.0000 mg | ORAL_TABLET | Freq: Once | ORAL | 0 refills | Status: AC

## 2016-06-19 NOTE — Patient Instructions (Signed)

## 2016-06-19 NOTE — Progress Notes (Signed)
Patient: Brandi Guzman, Female    DOB: April 13, 1961, 55 y.o.   MRN: [id number] Visit Date: 06/19/2016  Today's Provider: Margaretann Loveless, PA-C   Chief Complaint  Patient presents with  . Annual Exam   Subjective:    Annual physical exam Brandi Guzman is a 55 y.o. female who presents today for health maintenance and complete physical. She feels fairly well. She reports exercising. She reports she is sleeping well.  06-17-15 CPE 06/17/15 Pap-neg HPv-neg 07/06/13 BI-RADS 1 05/19/13 Colon-Normal ----------------------------------------------------------------- She is also concern about URI symptoms she has been having for a month. She reports the chest congestion is better. Now she is having sore throat and upper respiratory symptoms for a week and chills. No fever.  Review of Systems  Constitutional: Negative.   HENT: Positive for ear pain, postnasal drip, rhinorrhea, sinus pain, sinus pressure and sore throat.   Eyes: Negative.   Respiratory: Negative.   Cardiovascular: Negative.   Gastrointestinal: Negative.   Endocrine: Negative.   Genitourinary: Negative.   Musculoskeletal: Negative.   Skin: Negative.   Allergic/Immunologic: Negative.   Neurological: Negative.   Hematological: Negative.   Psychiatric/Behavioral: Negative.     Social History      She  reports that she has never smoked. She has never used smokeless tobacco. She reports that she drinks alcohol. She reports that she does not use drugs.       Social History   Social History  . Marital status: Married    Spouse name: Casimiro Needle  . Number of children: 2  . Years of education: College   Occupational History  .      Full-Time   Social History Main Topics  . Smoking status: Never Smoker  . Smokeless tobacco: Never Used  . Alcohol use Yes     Comment: Occasional alcohol use  . Drug use: No  . Sexual activity: Not Asked   Other Topics Concern  . None   Social History Narrative  . None      History reviewed. No pertinent past medical history.   Patient Active Problem List   Diagnosis Date Noted  . Vaginal atrophy 06/17/2015  . Absence of menstruation 01/28/2015  . Constipation 01/28/2015  . 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 Fatigue 01/28/2015  . Lower urinary tract infection 01/28/2015  . Allergic rhinitis 01/28/2015  . Pyelonephritis 01/28/2015    Past Surgical History:  Procedure Laterality Date  . APPENDECTOMY  09/30/2010  . OVARIAN CYST REMOVAL Left 1993    Family History        Family Status  Relation Status  . Mother Alive  . Father Deceased at age 66   Recenlty past away early 2016  . Brother Alive        Her family history includes CAD in her father; COPD in her mother; Diabetes in her mother; Stroke in her mother.     No Known Allergies   Current Outpatient Prescriptions:  Marland Kitchen  MULTIPLE VITAMIN PO, Take 1 tablet by mouth daily., Disp: , Rfl:  .  PREMPRO 0.625-5 MG tablet, take 1 tablet by mouth once daily, Disp: 28 tablet, Rfl: 5 .  Probiotic Product (PROBIOTIC DAILY PO), Take 1 capsule by mouth daily., Disp: , Rfl:  .  aspirin 81 MG tablet, Take 1 tablet by mouth daily., Disp: , Rfl:  .  BIOTIN PO, Take by mouth., Disp: , Rfl:    Patient Care Team: Margaretann Loveless, PA-C as  PCP - General (Family Medicine)      Objective:   Vitals: BP 118/70 (BP Location: Left Arm, Patient Position: Sitting, Cuff Size: Normal)   Pulse 62   Temp 97.9 F (36.6 C) (Oral)   Resp 16   Ht 5\' 4"  (1.626 m)   Wt 118 lb 3.2 oz (53.6 kg)   BMI 20.29 kg/m    Physical Exam  Constitutional: She is oriented to person, place, and time. She appears well-developed and well-nourished. No distress.  HENT:  Head: Normocephalic and atraumatic.  Right Ear: Tympanic membrane, external ear and ear canal normal.  Left Ear: Tympanic membrane, external ear and ear canal normal.  Nose: Nose normal. Right sinus exhibits no maxillary sinus tenderness and no frontal  sinus tenderness. Left sinus exhibits no maxillary sinus tenderness and no frontal sinus tenderness.  Mouth/Throat: Uvula is midline and mucous membranes are normal. Posterior oropharyngeal erythema present. No oropharyngeal exudate or posterior oropharyngeal edema.  Eyes: Conjunctivae and EOM are normal. Pupils are equal, round, and reactive to light. Right eye exhibits no discharge. Left eye exhibits no discharge. No scleral icterus.  Neck: Normal range of motion. Neck supple. No JVD present. No tracheal deviation present. No thyromegaly present.  Cardiovascular: Normal rate, regular rhythm, normal heart sounds and intact distal pulses.  Exam reveals no gallop and no friction rub.   No murmur heard. Pulmonary/Chest: Effort normal and breath sounds normal. No respiratory distress. She has no decreased breath sounds. She has no wheezes. She has no rhonchi. She has no rales. She exhibits no tenderness. Right breast exhibits no inverted nipple, no mass, no nipple discharge, no skin change and no tenderness. Left breast exhibits no inverted nipple, no mass, no nipple discharge, no skin change and no tenderness. Breasts are symmetrical.  Abdominal: Soft. Bowel sounds are normal. She exhibits no distension and no mass. There is no tenderness. There is no rebound and no guarding.  Musculoskeletal: Normal range of motion. She exhibits no edema or tenderness.  Lymphadenopathy:    She has no cervical adenopathy.  Neurological: She is alert and oriented to person, place, and time.  Skin: Skin is warm and dry. No rash noted. She is not diaphoretic.  Psychiatric: She has a normal mood and affect. Her behavior is normal. Judgment and thought content normal.  Vitals reviewed.   Depression Screen PHQ 2/9 Scores 06/17/2015  Exception Documentation Patient refusal    Assessment & Plan:     Routine Health Maintenance and Physical Exam  Exercise Activities and Dietary recommendations Goals    . Exercise  150 minutes per week (moderate activity)       Immunization History  Administered Date(s) Administered  . Influenza,inj,Quad PF,36+ Mos 06/17/2015  . Td 05/29/2002  . Tdap 11/02/2011  . Zoster 07/06/2014    Health Maintenance  Topic Date Due  . Hepatitis C Screening  [birth year]  . HIV Screening  07/19/1977  . MAMMOGRAM  07/07/2015  . INFLUENZA VACCINE  02/18/2016  . PAP SMEAR  06/16/2018  . TETANUS/TDAP  11/01/2021  . COLONOSCOPY  05/20/2023     Discussed health benefits of physical activity, and encouraged her to engage in regular exercise appropriate for her age and condition.    1. Annual physical exam Normal physical exam today. Will check labs as below and f/u pending lab results. If labs are stable and WNL she will not need to have these rechecked for one year at her next annual physical exam. She is to call the  office in the meantime if she has any acute issue, questions or concerns. - CBC with Differential/Platelet - Comprehensive metabolic panel - Hemoglobin A1c - Lipid panel - TSH  2. Breast cancer screening Breast exam today was normal. There is no family history of breast cancer. She does perform regular self breast exams. Mammogram was ordered as below. [facility] was given to patient so she may schedule her mammogram at her convenience. - MM DIGITAL SCREENING BILATERAL; Future  3. Hair loss Stable. Diagnosis pulled for medication refill. Continue current medical treatment plan. - estrogen, conjugated,-medroxyprogesterone (PREMPRO) 0.625-5 MG tablet; Take 1 tablet by mouth daily.  Dispense: 84 tablet; Refill: 3  4. Other fatigue Stable. Diagnosis pulled for medication refill. Continue current medical treatment plan. - estrogen, conjugated,-medroxyprogesterone (PREMPRO) 0.625-5 MG tablet; Take 1 tablet by mouth daily.  Dispense: 84 tablet; Refill: 3  5. Upper respiratory tract infection, unspecified type Worsening symptoms that  have not responded to OTC medications. Will give Omnicef as below. Continue allergy medications. Stay well hydrated and get plenty of rest. Call if no symptom improvement or if symptoms worsen. - cefdinir (OMNICEF) 300 MG capsule; Take 1 capsule (300 mg total) by mouth 2 (two) times daily.  Dispense: 14 capsule; Refill: 0  6. Antibiotic-induced yeast infection Previously had yeast infection with antibiotic use. Will give diflucan as below in case it is needed.  - fluconazole (DIFLUCAN) 150 MG tablet; Take 1 tablet (150 mg total) by mouth once.  Dispense: 1 tablet; Refill: 0  7. Need for hepatitis C screening test - Hepatitis C Antibody  8. Need for influenza vaccination Flu vaccine given today without complication. Patient sat upright for 15 minutes to check for adverse reaction before being released. - Flu Vaccine QUAD 36+ mos PF IM (Fluarix & Fluzone Quad PF)  9. Flu-like symptoms Patient requested Rx for tamiflu to have on hand in case she gets the flu from the vaccination. I explained this was not plausible for her to get the flu from the vaccination because it is a dead virus. She reports she understands but would still like a written Rx just in case because she knows it has to be started very soon after symptom onset and her work schedule does not allow her to be seen in a timely manner.  - oseltamivir (TAMIFLU) 75 MG capsule; Take 1 capsule (75 mg total) by mouth 2 (two) times daily.  Dispense: 10 capsule; Refill: 0  --------------------------------------------------------------------    Margaretann LovelessJennifer M Burnette, PA-C  [facility]

## 2016-06-20 LAB — LIPID PANEL
CHOL/HDL RATIO: 2.5 ratio (ref 0.0–4.4)
Cholesterol, Total: 160 mg/dL (ref 100–199)
HDL: 63 mg/dL (ref >39)
LDL CALC: 82 mg/dL (ref 0–99)
TRIGLYCERIDES: 77 mg/dL (ref 0–149)
VLDL Cholesterol Cal: 15 mg/dL (ref 5–40)

## 2016-06-20 LAB — CBC WITH DIFFERENTIAL/PLATELET
BASOS: 0 %
Basophils Absolute: 0 10*3/uL (ref 0.0–0.2)
EOS (ABSOLUTE): 0.2 10*3/uL (ref 0.0–0.4)
EOS: 3 %
HEMATOCRIT: 43.3 % (ref 34.0–46.6)
Hemoglobin: 14.9 g/dL (ref 11.1–15.9)
IMMATURE GRANS (ABS): 0 10*3/uL (ref 0.0–0.1)
IMMATURE GRANULOCYTES: 1 %
LYMPHS: 28 %
Lymphocytes Absolute: 2.1 10*3/uL (ref 0.7–3.1)
MCH: 32.2 pg (ref 26.6–33.0)
MCHC: 34.4 g/dL (ref 31.5–35.7)
MCV: 94 fL (ref 79–97)
Monocytes Absolute: 0.5 10*3/uL (ref 0.1–0.9)
Monocytes: 6 %
NEUTROS PCT: 62 %
Neutrophils Absolute: 4.8 10*3/uL (ref 1.4–7.0)
PLATELETS: 164 10*3/uL (ref 150–379)
RBC: 4.63 x10E6/uL (ref 3.77–5.28)
RDW: 13.7 % (ref 12.3–15.4)
WBC: 7.6 10*3/uL (ref 3.4–10.8)

## 2016-06-20 LAB — COMPREHENSIVE METABOLIC PANEL
A/G RATIO: 1.8 (ref 1.2–2.2)
ALBUMIN: 4 g/dL (ref 3.5–5.5)
ALK PHOS: 48 IU/L (ref 39–117)
ALT: 21 IU/L (ref 0–32)
AST: 9 IU/L (ref 0–40)
BILIRUBIN TOTAL: 0.4 mg/dL (ref 0.0–1.2)
BUN / CREAT RATIO: 19 (ref 9–23)
BUN: 15 mg/dL (ref 6–24)
CHLORIDE: 97 mmol/L (ref 96–106)
CO2: 24 mmol/L (ref 18–29)
Calcium: 8.9 mg/dL (ref 8.7–10.2)
Creatinine, Ser: 0.79 mg/dL (ref 0.57–1.00)
GFR calc non Af Amer: 86 mL/min/{1.73_m2} (ref >59)
GFR, EST AFRICAN AMERICAN: 99 mL/min/{1.73_m2} (ref >59)
GLUCOSE: 79 mg/dL (ref 65–99)
Globulin, Total: 2.2 g/dL (ref 1.5–4.5)
POTASSIUM: 4 mmol/L (ref 3.5–5.2)
Sodium: 137 mmol/L (ref 134–144)
TOTAL PROTEIN: 6.2 g/dL (ref 6.0–8.5)

## 2016-06-20 LAB — HEMOGLOBIN A1C
Est. average glucose Bld gHb Est-mCnc: 103 mg/dL
HEMOGLOBIN A1C: 5.2 % (ref 4.8–5.6)

## 2016-06-20 LAB — HEPATITIS C ANTIBODY

## 2016-06-20 LAB — TSH: TSH: 1.1 u[IU]/mL (ref 0.450–4.500)

## 2016-06-22 ENCOUNTER — Telehealth: Payer: Self-pay

## 2016-06-22 NOTE — Telephone Encounter (Signed)
LMTCB  Thanks,  -Joseline 

## 2016-06-22 NOTE — Telephone Encounter (Signed)
Patient was advised as directed below.  Thanks,  -Daxen Lanum 

## 2016-06-22 NOTE — Telephone Encounter (Signed)
-----   Message from Margaretann LovelessJennifer M Burnette, [place]PA-C sent at 06/22/2016  9:05 AM EST ----- All labs are within normal limits and stable.  Thanks! -JB

## 2016-07-03 ENCOUNTER — Telehealth: Payer: Self-pay | Admitting: *Deleted

## 2016-07-03 NOTE — Telephone Encounter (Signed)
"  I'm scheduled for a bunionectomy on January 2.  [facility]  I was wondering if it had been scheduled and if so what time will it be.  I'm trying to plan my schedule."

## 2016-07-21 ENCOUNTER — Encounter: Payer: Self-pay | Admitting: Podiatry

## 2016-07-21 DIAGNOSIS — M2011 Hallux valgus (acquired), right foot: Secondary | ICD-10-CM | POA: Diagnosis not present

## 2016-07-21 DIAGNOSIS — M21611 Bunion of right foot: Secondary | ICD-10-CM | POA: Diagnosis not present

## 2016-07-21 DIAGNOSIS — E7800 Pure hypercholesterolemia, unspecified: Secondary | ICD-10-CM | POA: Diagnosis not present

## 2016-07-21 DIAGNOSIS — M205X1 Other deformities of toe(s) (acquired), right foot: Secondary | ICD-10-CM | POA: Diagnosis not present

## 2016-07-22 ENCOUNTER — Telehealth: Payer: Self-pay | Admitting: *Deleted

## 2016-07-22 NOTE — Telephone Encounter (Addendum)
Pt states foot gets numb when elevated above her heart. I told pt to take off the cam walker, open-ended sock, ace wrap and dangle for 15 minutes, this being the only time it was okay to dangle, after 15 minutes then place foot at hip level and rewrap from toes up foot looser, reapply sock and cam walker. I told pt to keep weight bearing to 6615minutes/hour, remain in boot at all times and keep dressing clean and dry. Pt asked when she could wean off the percocet and I told her that she should take as directed at least the 1st week Post op, but can take OTC ibuprofen as package directs in between percocet if she is able to tolerate ibuprofen. Pt states understanding. I told pt to call with concerns.07/29/2016-Pt states was seen in office today and dressing was removed and Dr. Charlsie Merlesegal applied an ace wrap and she is back in the surgical boot. Pt states she got to work and removed the boot and sock and the area near the toes not covered with the ace wrap had bled, is that normal. I spoke with pt and explained that the stiffness of the ace may havevcaused the scabs to be rubbed and disrupted. I instructed the pt to cover the area with a bandaid or dressing to protect from the ace and environment. Pt states understanding.

## 2016-07-29 ENCOUNTER — Ambulatory Visit (INDEPENDENT_AMBULATORY_CARE_PROVIDER_SITE_OTHER): Payer: BLUE CROSS/BLUE SHIELD | Admitting: Podiatry

## 2016-07-29 ENCOUNTER — Ambulatory Visit (INDEPENDENT_AMBULATORY_CARE_PROVIDER_SITE_OTHER): Payer: BLUE CROSS/BLUE SHIELD

## 2016-07-29 VITALS — Temp 98.2°F

## 2016-07-29 DIAGNOSIS — M21619 Bunion of unspecified foot: Secondary | ICD-10-CM

## 2016-07-29 NOTE — Progress Notes (Signed)
Subjective:     Patient ID: Brandi Guzman, female   DOB: February 19, 1961, 56 y.o.   MRN: [id number]  HPI patient states that she's doing great with her right foot with good alignment noted and minimal discomfort or swelling   Review of Systems     Objective:   Physical Exam Neurovascular status intact negative Homans sign was noted with good alignment of the first metatarsal hallux with incision site well healed with incision site well coapted and no indications of skin breakdown    Assessment:     Doing well post metatarsal osteotomy and Akin osteotomy right with good alignment noted and range of motion    Plan:     X-ray reviewed with patient sterile dressing reapplied instructed on continued elevation immobilization compression and reappoint in 2 weeks or earlier if any issues should occur  X-ray report indicates the osteotomy is healing well wires in place with no signs of movement

## 2016-08-13 ENCOUNTER — Ambulatory Visit (INDEPENDENT_AMBULATORY_CARE_PROVIDER_SITE_OTHER): Payer: BLUE CROSS/BLUE SHIELD

## 2016-08-13 ENCOUNTER — Ambulatory Visit (INDEPENDENT_AMBULATORY_CARE_PROVIDER_SITE_OTHER): Payer: BLUE CROSS/BLUE SHIELD | Admitting: Podiatry

## 2016-08-13 DIAGNOSIS — M21619 Bunion of unspecified foot: Secondary | ICD-10-CM

## 2016-08-13 NOTE — Progress Notes (Signed)
Subjective:     Patient ID: Brandi Guzman, female   DOB: 1960/11/02, 56 y.o.   MRN: [id number]  HPI patient states she's doing great with the right foot with patient's structure looking good wound edges well coapted and good alignment noted   Review of Systems     Objective:   Physical Exam Neurovascular status intact negative Homans sign was noted with patient's right foot doing very well with good alignment of the first MPJ and good range of motion    Assessment:     Doing well post osteotomy first metatarsal right    Plan:     Reviewed continued range of motion exercises gradual return to normal shoe gear and reappoint 4 weeks or earlier if needed  X-rays indicate good healing of the osteotomy first metatarsal hallux with pin and wire are intact and good structure noted

## 2016-08-18 ENCOUNTER — Encounter: Payer: Self-pay | Admitting: Podiatry

## 2016-08-25 NOTE — Progress Notes (Signed)
DOS 01.02.2018 [place] bunionectomy (cutting and moving bone) with fixation right. Possible aiken with wire right.

## 2016-09-09 ENCOUNTER — Ambulatory Visit (INDEPENDENT_AMBULATORY_CARE_PROVIDER_SITE_OTHER): Payer: BLUE CROSS/BLUE SHIELD

## 2016-09-09 ENCOUNTER — Ambulatory Visit (INDEPENDENT_AMBULATORY_CARE_PROVIDER_SITE_OTHER): Payer: BLUE CROSS/BLUE SHIELD | Admitting: Podiatry

## 2016-09-09 DIAGNOSIS — M21619 Bunion of unspecified foot: Secondary | ICD-10-CM

## 2016-09-09 NOTE — Progress Notes (Signed)
Subjective:     Patient ID: Brandi Guzman, female   DOB: May 14, 1961, 56 y.o.   MRN: [id number]  HPI patient presents stating that my right foot still doing real well with mild edema and no other problems noted   Review of Systems     Objective:   Physical Exam Neurovascular status negative Homans sign was noted with patient's right foot doing well with good alignment noted good range of motion with no crepitus of the joint surface    Assessment:     Doing well post osteotomy first metatarsal right right hallux    Plan:     H&P x-rays reviewed and discussed continuation of conservative care compression elevation and range of motion exercises. I also discussed long-term orthotics and it next visit we will consider that control chronic tendinitis and foot structure  X-ray indicates pins and wire in place with good alignment of the first metatarsal hallux

## 2016-10-28 ENCOUNTER — Ambulatory Visit: Payer: BLUE CROSS/BLUE SHIELD | Admitting: Podiatry

## 2016-11-04 ENCOUNTER — Ambulatory Visit (INDEPENDENT_AMBULATORY_CARE_PROVIDER_SITE_OTHER): Payer: BLUE CROSS/BLUE SHIELD | Admitting: Podiatry

## 2016-11-04 ENCOUNTER — Ambulatory Visit (INDEPENDENT_AMBULATORY_CARE_PROVIDER_SITE_OTHER): Payer: BLUE CROSS/BLUE SHIELD

## 2016-11-04 ENCOUNTER — Encounter: Payer: Self-pay | Admitting: Podiatry

## 2016-11-04 DIAGNOSIS — M21619 Bunion of unspecified foot: Secondary | ICD-10-CM

## 2016-11-04 NOTE — Progress Notes (Signed)
Subjective:     Patient ID: Brandi Guzman, female   DOB: Oct 10, 1960, 56 y.o.   MRN: [id number]  HPI patient states she's doing well but still getting some swelling in her foot and cannot do everything she wants   Review of Systems     Objective:   Physical Exam Neurovascular status intact with well-healed surgical site right first metatarsal right hallux with good alignment joint rectus. The patient has mild tendinitis-like symptoms due to foot structure    Assessment:     Normal edema after this amount of surgery 3 months after the procedure. Also noted to have mild tendinitis    Plan:     H&P condition reviewed x-rays reviewed. At this point I have recommended continued conservative care and strengthening of the hallux with possibilities long-term for orthotic therapy. Reappoint 8 weeks to recheck  X-ray indicated the osteotomies are healing well with good alignment noted and no signs of pathology

## 2017-01-06 ENCOUNTER — Ambulatory Visit: Payer: BLUE CROSS/BLUE SHIELD | Admitting: Podiatry

## 2017-07-31 ENCOUNTER — Other Ambulatory Visit: Payer: Self-pay | Admitting: Physician Assistant

## 2017-07-31 DIAGNOSIS — L659 Nonscarring hair loss, unspecified: Secondary | ICD-10-CM

## 2017-07-31 DIAGNOSIS — R5383 Other fatigue: Secondary | ICD-10-CM

## 2017-08-17 ENCOUNTER — Ambulatory Visit: Payer: BLUE CROSS/BLUE SHIELD | Admitting: Physician Assistant

## 2017-08-17 ENCOUNTER — Encounter: Payer: Self-pay | Admitting: Physician Assistant

## 2017-08-17 VITALS — BP 110/60 | HR 60 | Temp 98.1°F | Resp 16 | Ht 64.0 in | Wt 115.4 lb

## 2017-08-17 DIAGNOSIS — K644 Residual hemorrhoidal skin tags: Secondary | ICD-10-CM | POA: Diagnosis not present

## 2017-08-17 DIAGNOSIS — Z1231 Encounter for screening mammogram for malignant neoplasm of breast: Secondary | ICD-10-CM | POA: Diagnosis not present

## 2017-08-17 DIAGNOSIS — Z124 Encounter for screening for malignant neoplasm of cervix: Secondary | ICD-10-CM

## 2017-08-17 DIAGNOSIS — Z13820 Encounter for screening for osteoporosis: Secondary | ICD-10-CM | POA: Diagnosis not present

## 2017-08-17 DIAGNOSIS — Z1211 Encounter for screening for malignant neoplasm of colon: Secondary | ICD-10-CM

## 2017-08-17 DIAGNOSIS — Z780 Asymptomatic menopausal state: Secondary | ICD-10-CM

## 2017-08-17 DIAGNOSIS — Z0000 Encounter for general adult medical examination without abnormal findings: Secondary | ICD-10-CM | POA: Diagnosis not present

## 2017-08-17 DIAGNOSIS — Z1239 Encounter for other screening for malignant neoplasm of breast: Secondary | ICD-10-CM

## 2017-08-17 LAB — IFOBT (OCCULT BLOOD): IFOBT: NEGATIVE

## 2017-08-17 NOTE — Patient Instructions (Signed)
Health Maintenance for Postmenopausal Women Menopause is a normal process in which your reproductive ability comes to an end. This process happens gradually over a span of months to years, usually between the ages of 22 and 9. Menopause is complete when you have missed 12 consecutive menstrual periods. It is important to talk with your health care provider about some of the most common conditions that affect postmenopausal women, such as heart disease, cancer, and bone loss (osteoporosis). Adopting a healthy lifestyle and getting preventive care can help to promote your health and wellness. Those actions can also lower your chances of developing some of these common conditions. What should I know about menopause? During menopause, you may experience a number of symptoms, such as:  Moderate-to-severe hot flashes.  Night sweats.  Decrease in sex drive.  Mood swings.  Headaches.  Tiredness.  Irritability.  Memory problems.  Insomnia.  Choosing to treat or not to treat menopausal changes is an individual decision that you make with your health care provider. What should I know about hormone replacement therapy and supplements? Hormone therapy products are effective for treating symptoms that are associated with menopause, such as hot flashes and night sweats. Hormone replacement carries certain risks, especially as you become older. If you are thinking about using estrogen or estrogen with progestin treatments, discuss the benefits and risks with your health care provider. What should I know about heart disease and stroke? Heart disease, heart attack, and stroke become more likely as you age. This may be due, in part, to the hormonal changes that your body experiences during menopause. These can affect how your body processes dietary fats, triglycerides, and cholesterol. Heart attack and stroke are both medical emergencies. There are many things that you can do to help prevent heart disease  and stroke:  Have your blood pressure checked at least every 1-2 years. High blood pressure causes heart disease and increases the risk of stroke.  If you are 53-22 years old, ask your health care provider if you should take aspirin to prevent a heart attack or a stroke.  Do not use any tobacco products, including cigarettes, chewing tobacco, or electronic cigarettes. If you need help quitting, ask your health care provider.  It is important to eat a healthy diet and maintain a healthy weight. ? Be sure to include plenty of vegetables, fruits, low-fat dairy products, and lean protein. ? Avoid eating foods that are high in solid fats, added sugars, or salt (sodium).  Get regular exercise. This is one of the most important things that you can do for your health. ? Try to exercise for at least 150 minutes each week. The type of exercise that you do should increase your heart rate and make you sweat. This is known as moderate-intensity exercise. ? Try to do strengthening exercises at least twice each week. Do these in addition to the moderate-intensity exercise.  Know your numbers.Ask your health care provider to check your cholesterol and your blood glucose. Continue to have your blood tested as directed by your health care provider.  What should I know about cancer screening? There are several types of cancer. Take the following steps to reduce your risk and to catch any cancer development as early as possible. Breast Cancer  Practice breast self-awareness. ? This means understanding how your breasts normally appear and feel. ? It also means doing regular breast self-exams. Let your health care provider know about any changes, no matter how small.  If you are 40  or older, have a clinician do a breast exam (clinical breast exam or CBE) every year. Depending on your age, family history, and medical history, it may be recommended that you also have a yearly breast X-ray (mammogram).  If you  have a family history of breast cancer, talk with your health care provider about genetic screening.  If you are at high risk for breast cancer, talk with your health care provider about having an MRI and a mammogram every year.  Breast cancer (BRCA) gene test is recommended for women who have family members with BRCA-related cancers. Results of the assessment will determine the need for genetic counseling and BRCA1 and for BRCA2 testing. BRCA-related cancers include these types: ? Breast. This occurs in males or females. ? Ovarian. ? Tubal. This may also be called fallopian tube cancer. ? Cancer of the abdominal or pelvic lining (peritoneal cancer). ? Prostate. ? Pancreatic.  Cervical, Uterine, and Ovarian Cancer Your health care provider may recommend that you be screened regularly for cancer of the pelvic organs. These include your ovaries, uterus, and vagina. This screening involves a pelvic exam, which includes checking for microscopic changes to the surface of your cervix (Pap test).  For women ages 21-65, health care providers may recommend a pelvic exam and a Pap test every three years. For women ages 79-65, they may recommend the Pap test and pelvic exam, combined with testing for human papilloma virus (HPV), every five years. Some types of HPV increase your risk of cervical cancer. Testing for HPV may also be done on women of any age who have unclear Pap test results.  Other health care providers may not recommend any screening for nonpregnant women who are considered low risk for pelvic cancer and have no symptoms. Ask your health care provider if a screening pelvic exam is right for you.  If you have had past treatment for cervical cancer or a condition that could lead to cancer, you need Pap tests and screening for cancer for at least 20 years after your treatment. If Pap tests have been discontinued for you, your risk factors (such as having a new sexual partner) need to be  reassessed to determine if you should start having screenings again. Some women have medical problems that increase the chance of getting cervical cancer. In these cases, your health care provider may recommend that you have screening and Pap tests more often.  If you have a family history of uterine cancer or ovarian cancer, talk with your health care provider about genetic screening.  If you have vaginal bleeding after reaching menopause, tell your health care provider.  There are currently no reliable tests available to screen for ovarian cancer.  Lung Cancer Lung cancer screening is recommended for adults 69-62 years old who are at high risk for lung cancer because of a history of smoking. A yearly low-dose CT scan of the lungs is recommended if you:  Currently smoke.  Have a history of at least 30 pack-years of smoking and you currently smoke or have quit within the past 15 years. A pack-year is smoking an average of one pack of cigarettes per day for one year.  Yearly screening should:  Continue until it has been 15 years since you quit.  Stop if you develop a health problem that would prevent you from having lung cancer treatment.  Colorectal Cancer  This type of cancer can be detected and can often be prevented.  Routine colorectal cancer screening usually begins at  age 42 and continues through age 45.  If you have risk factors for colon cancer, your health care provider may recommend that you be screened at an earlier age.  If you have a family history of colorectal cancer, talk with your health care provider about genetic screening.  Your health care provider may also recommend using home test kits to check for hidden blood in your stool.  A small camera at the end of a tube can be used to examine your colon directly (sigmoidoscopy or colonoscopy). This is done to check for the earliest forms of colorectal cancer.  Direct examination of the colon should be repeated every  5-10 years until age 71. However, if early forms of precancerous polyps or small growths are found or if you have a family history or genetic risk for colorectal cancer, you may need to be screened more often.  Skin Cancer  Check your skin from head to toe regularly.  Monitor any moles. Be sure to tell your health care provider: ? About any new moles or changes in moles, especially if there is a change in a mole's shape or color. ? If you have a mole that is larger than the size of a pencil eraser.  If any of your family members has a history of skin cancer, especially at a young age, talk with your health care provider about genetic screening.  Always use sunscreen. Apply sunscreen liberally and repeatedly throughout the day.  Whenever you are outside, protect yourself by wearing long sleeves, pants, a wide-brimmed hat, and sunglasses.  What should I know about osteoporosis? Osteoporosis is a condition in which bone destruction happens more quickly than new bone creation. After menopause, you may be at an increased risk for osteoporosis. To help prevent osteoporosis or the bone fractures that can happen because of osteoporosis, the following is recommended:  If you are 46-71 years old, get at least 1,000 mg of calcium and at least 600 mg of vitamin D per day.  If you are older than age 55 but younger than age 65, get at least 1,200 mg of calcium and at least 600 mg of vitamin D per day.  If you are older than age 54, get at least 1,200 mg of calcium and at least 800 mg of vitamin D per day.  Smoking and excessive alcohol intake increase the risk of osteoporosis. Eat foods that are rich in calcium and vitamin D, and do weight-bearing exercises several times each week as directed by your health care provider. What should I know about how menopause affects my mental health? Depression may occur at any age, but it is more common as you become older. Common symptoms of depression  include:  Low or sad mood.  Changes in sleep patterns.  Changes in appetite or eating patterns.  Feeling an overall lack of motivation or enjoyment of activities that you previously enjoyed.  Frequent crying spells.  Talk with your health care provider if you think that you are experiencing depression. What should I know about immunizations? It is important that you get and maintain your immunizations. These include:  Tetanus, diphtheria, and pertussis (Tdap) booster vaccine.  Influenza every year before the flu season begins.  Pneumonia vaccine.  Shingles vaccine.  Your health care provider may also recommend other immunizations. This information is not intended to replace advice given to you by your health care provider. Make sure you discuss any questions you have with your health care provider. Document Released: 08/28/2005  Document Revised: 01/24/2016 Document Reviewed: 04/09/2015 Elsevier Interactive Patient Education  2018 [employer]  

## 2017-08-17 NOTE — Progress Notes (Signed)
Patient: Brandi Guzman, Female    DOB: Feb 14, 1961, 57 y.o.   MRN: [id number] Visit Date: 08/17/2017  Today's Provider: Margaretann LovelessJennifer M Burnette, PA-C   Chief Complaint  Patient presents with  . Annual Exam   Subjective:    Annual physical exam Brandi Guzman is a 57 y.o. female who presents today for health maintenance and complete physical. She feels well. She reports exercising. She reports she is sleeping well. -----------------------------------------------------------------   Review of Systems  Constitutional: Negative.   HENT: Negative.   Eyes: Negative.   Respiratory: Negative.   Cardiovascular: Negative.   Gastrointestinal: Negative.   Endocrine: Negative.   Genitourinary: Negative.   Musculoskeletal: Negative.   Skin: Negative.   Allergic/Immunologic: Negative.   Neurological: Negative.   Hematological: Negative.   Psychiatric/Behavioral: Negative.     Social History      She  reports that  has never smoked. she has never used smokeless tobacco. She reports that she drinks alcohol. She reports that she does not use drugs.       Social History   Socioeconomic History  . Marital status: Married    Spouse name: Casimiro NeedleMichael  . Number of children: 2  . Years of education: College  . Highest education level: None  Social Needs  . Financial resource strain: None  . Food insecurity - worry: None  . Food insecurity - inability: None  . Transportation needs - medical: None  . Transportation needs - non-medical: None  Occupational History    Comment: Full-Time  Tobacco Use  . Smoking status: Never Smoker  . Smokeless tobacco: Never Used  Substance and Sexual Activity  . Alcohol use: Yes    Comment: Occasional alcohol use  . Drug use: No  . Sexual activity: None  Other Topics Concern  . None  Social History Narrative  . None    History reviewed. No pertinent past medical history.   Patient Active Problem List   Diagnosis Date Noted  . 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 Absence of menstruation 01/28/2015  . Constipation 01/28/2015  . 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 Fatigue 01/28/2015  . Lower urinary tract infection 01/28/2015  . Allergic rhinitis 01/28/2015  . Pyelonephritis 01/28/2015    Past Surgical History:  Procedure Laterality Date  . APPENDECTOMY  09/30/2010  . OVARIAN CYST REMOVAL Left 1993    Family History        Family Status  Relation Name Status  . Mother  Alive  . Father  Deceased at age 56       Recenlty past away early 2016  . Brother  Alive        Her family history includes CAD in her father; COPD in her mother; Diabetes in her mother; Stroke in her mother.     No Known Allergies   Current Outpatient Medications:  .  aspirin 81 MG tablet, Take 1 tablet by mouth daily., Disp: , Rfl:  .  BIOTIN PO, Take by mouth., Disp: , Rfl:  .  MULTIPLE VITAMIN PO, Take 1 tablet by mouth daily., Disp: , Rfl:  .  PREMPRO 0.625-5 MG tablet, take 1 tablet by mouth once daily, Disp: 84 tablet, Rfl: 3 .  Probiotic Product (PROBIOTIC DAILY PO), Take 1 capsule by mouth daily., Disp: , Rfl:    Patient Care Team: Reine JustBurnette, Jennifer M, PA-C as PCP - General (Family Medicine)      Objective:   Vitals: BP 110/60 (BP Location:  Left Arm, Patient Position: Sitting, Cuff Size: Normal)   Pulse 60   Temp 98.1 F (36.7 C) (Oral)   Resp 16   Ht 5\' 4"  (1.626 m)   Wt 115 lb 6.4 oz (52.3 kg)   BMI 19.81 kg/m    Physical Exam  Constitutional: She is oriented to person, place, and time. She appears well-developed and well-nourished. No distress.  HENT:  Head: Normocephalic and atraumatic.  Right Ear: Hearing, tympanic membrane, external ear and ear canal normal.  Left Ear: Hearing, tympanic membrane, external ear and ear canal normal.  Nose: Nose normal.  Mouth/Throat: Uvula is midline, oropharynx is clear and moist and mucous membranes are normal. No oropharyngeal exudate.  Eyes: Conjunctivae and EOM are normal.  Pupils are equal, round, and reactive to light. Right eye exhibits no discharge. Left eye exhibits no discharge. No scleral icterus.  Neck: Normal range of motion. Neck supple. No JVD present. Carotid bruit is not present. No tracheal deviation present. No thyromegaly present.  Cardiovascular: Normal rate, regular rhythm, normal heart sounds and intact distal pulses. Exam reveals no gallop and no friction rub.  No murmur heard. Pulmonary/Chest: Effort normal and breath sounds normal. No respiratory distress. She has no wheezes. She has no rales. She exhibits no tenderness. Right breast exhibits no inverted nipple, no mass, no nipple discharge, no skin change and no tenderness. Left breast exhibits no inverted nipple, no mass, no nipple discharge, no skin change and no tenderness. Breasts are symmetrical.  Abdominal: Soft. Bowel sounds are normal. She exhibits no distension and no mass. There is no tenderness. There is no rebound and no guarding. Hernia confirmed negative in the right inguinal area and confirmed negative in the left inguinal area.  Genitourinary: Rectum normal, vagina normal and uterus normal.    No breast swelling, tenderness, discharge or bleeding. Pelvic exam was performed with patient supine. There is no rash, tenderness, lesion or injury on the right labia. There is no rash, tenderness, lesion or injury on the left labia. Cervix exhibits no motion tenderness, no discharge and no friability. Right adnexum displays no mass, no tenderness and no fullness. Left adnexum displays no mass, no tenderness and no fullness. No erythema, tenderness or bleeding in the vagina. No signs of injury around the vagina. No vaginal discharge found.  Musculoskeletal: Normal range of motion. She exhibits no edema or tenderness.  Lymphadenopathy:    She has no cervical adenopathy.       Right: No inguinal adenopathy present.       Left: No inguinal adenopathy present.  Neurological: She is alert and  oriented to person, place, and time. She has normal reflexes. No cranial nerve deficit. Coordination normal.  Skin: Skin is warm and dry. No rash noted. She is not diaphoretic.  Psychiatric: She has a normal mood and affect. Her behavior is normal. Judgment and thought content normal.  Vitals reviewed.    Depression Screen PHQ 2/9 Scores 08/17/2017 06/17/2015  PHQ - 2 Score 0 -  Exception Documentation - Patient refusal      Assessment & Plan:     Routine Health Maintenance and Physical Exam  Exercise Activities and Dietary recommendations Goals    . Exercise 150 minutes per week (moderate activity)       Immunization History  Administered Date(s) Administered  . Influenza,inj,Quad PF,6+ Mos 06/17/2015, 06/19/2016  . Td 05/29/2002  . Tdap 11/02/2011  . Zoster 07/06/2014    Health Maintenance  Topic Date Due  . HIV  Screening  07/19/1977  . MAMMOGRAM  07/07/2015  . INFLUENZA VACCINE  02/17/2017  . PAP SMEAR  06/16/2018  . TETANUS/TDAP  11/01/2021  . COLONOSCOPY  05/20/2023  . Hepatitis C Screening  Completed     Discussed health benefits of physical activity, and encouraged her to engage in regular exercise appropriate for her age and condition.    1. Annual physical exam Normal physical exam today. Will check labs as below and f/u pending lab results. If labs are stable and WNL she will not need to have these rechecked for one year at her next annual physical exam. She is to call the office in the meantime if she has any acute issue, questions or concerns. - CBC with Differential/Platelet - Comprehensive metabolic panel - Lipid panel - Hemoglobin A1c - TSH  2. Breast cancer screening Breast exam today was normal. There is no family history of breast cancer. She does perform regular self breast exams. Mammogram was ordered as below. Information for [facility] was given to patient so she may schedule her mammogram at her convenience. - MM DIGITAL  SCREENING BILATERAL; Future  3. Cervical cancer screening Pap collected today. Will send as below and f/u pending results. - Pap IG and HPV (high risk) DNA detection  4. Colon cancer screening OC lite collected today and was negative. - IFOBT POC (occult bld, rslt in office)  5. External hemorrhoid Not problematic at this time.   6. Postmenopausal estrogen deficiency No previous bone density. Will get postmenopausal baseline screen as below.  - DG Bone Density; Future  7. Osteoporosis screening See above medical treatment plan. - DG Bone Density; Future  --------------------------------------------------------------------    Margaretann Loveless, PA-C  [facility]

## 2017-08-18 ENCOUNTER — Telehealth: Payer: Self-pay

## 2017-08-18 LAB — COMPREHENSIVE METABOLIC PANEL
ALBUMIN: 4.4 g/dL (ref 3.5–5.5)
ALK PHOS: 51 IU/L (ref 39–117)
ALT: 30 IU/L (ref 0–32)
AST: 12 IU/L (ref 0–40)
Albumin/Globulin Ratio: 1.9 (ref 1.2–2.2)
BUN / CREAT RATIO: 18 (ref 9–23)
BUN: 14 mg/dL (ref 6–24)
Bilirubin Total: 0.5 mg/dL (ref 0.0–1.2)
CO2: 24 mmol/L (ref 20–29)
CREATININE: 0.78 mg/dL (ref 0.57–1.00)
Calcium: 9.5 mg/dL (ref 8.7–10.2)
Chloride: 100 mmol/L (ref 96–106)
GFR calc non Af Amer: 86 mL/min/{1.73_m2} (ref >59)
GFR, EST AFRICAN AMERICAN: 99 mL/min/{1.73_m2} (ref >59)
Globulin, Total: 2.3 g/dL (ref 1.5–4.5)
Glucose: 81 mg/dL (ref 65–99)
Potassium: 3.9 mmol/L (ref 3.5–5.2)
Sodium: 141 mmol/L (ref 134–144)
TOTAL PROTEIN: 6.7 g/dL (ref 6.0–8.5)

## 2017-08-18 LAB — LIPID PANEL
CHOLESTEROL TOTAL: 184 mg/dL (ref 100–199)
Chol/HDL Ratio: 2.5 ratio (ref 0.0–4.4)
HDL: 75 mg/dL (ref >39)
LDL CALC: 99 mg/dL (ref 0–99)
Triglycerides: 52 mg/dL (ref 0–149)
VLDL CHOLESTEROL CAL: 10 mg/dL (ref 5–40)

## 2017-08-18 LAB — HEMOGLOBIN A1C
ESTIMATED AVERAGE GLUCOSE: 105 mg/dL
Hgb A1c MFr Bld: 5.3 % (ref 4.8–5.6)

## 2017-08-18 LAB — CBC WITH DIFFERENTIAL/PLATELET
BASOS ABS: 0 10*3/uL (ref 0.0–0.2)
Basos: 0 %
EOS (ABSOLUTE): 0.1 10*3/uL (ref 0.0–0.4)
Eos: 2 %
HEMOGLOBIN: 13.9 g/dL (ref 11.1–15.9)
Hematocrit: 40.3 % (ref 34.0–46.6)
IMMATURE GRANS (ABS): 0 10*3/uL (ref 0.0–0.1)
Immature Granulocytes: 0 %
LYMPHS: 31 %
Lymphocytes Absolute: 1.8 10*3/uL (ref 0.7–3.1)
MCH: 31.5 pg (ref 26.6–33.0)
MCHC: 34.5 g/dL (ref 31.5–35.7)
MCV: 91 fL (ref 79–97)
Monocytes Absolute: 0.5 10*3/uL (ref 0.1–0.9)
Monocytes: 8 %
Neutrophils Absolute: 3.5 10*3/uL (ref 1.4–7.0)
Neutrophils: 59 %
Platelets: 184 10*3/uL (ref 150–379)
RBC: 4.41 x10E6/uL (ref 3.77–5.28)
RDW: 13.3 % (ref 12.3–15.4)
WBC: 6 10*3/uL (ref 3.4–10.8)

## 2017-08-18 LAB — TSH: TSH: 1.98 u[IU]/mL (ref 0.450–4.500)

## 2017-08-18 NOTE — Telephone Encounter (Signed)
LMTCB  Thanks,  -Joseline 

## 2017-08-18 NOTE — Telephone Encounter (Signed)
Patient advised.KW 

## 2017-08-18 NOTE — Telephone Encounter (Signed)
-----   Message from Margaretann LovelessJennifer M Burnette, PA-C sent at 08/18/2017  8:25 AM EST ----- All labs are within normal limits and stable.  Thanks! -JB

## 2017-08-19 LAB — PAP IG AND HPV HIGH-RISK
HPV, high-risk: NEGATIVE
PAP SMEAR COMMENT: 0

## 2017-09-09 ENCOUNTER — Other Ambulatory Visit: Payer: Self-pay

## 2017-11-18 ENCOUNTER — Ambulatory Visit: Payer: BLUE CROSS/BLUE SHIELD | Admitting: Physician Assistant

## 2017-11-18 ENCOUNTER — Encounter: Payer: Self-pay | Admitting: Physician Assistant

## 2017-11-18 VITALS — BP 130/80 | HR 84 | Temp 98.3°F | Resp 16 | Wt 115.8 lb

## 2017-11-18 DIAGNOSIS — J40 Bronchitis, not specified as acute or chronic: Secondary | ICD-10-CM | POA: Diagnosis not present

## 2017-11-18 MED ORDER — PREDNISONE 10 MG (21) PO TBPK: ORAL_TABLET | ORAL | 0 refills | Status: DC

## 2017-11-18 MED ORDER — ALBUTEROL SULFATE HFA 108 (90 BASE) MCG/ACT IN AERS: 2.0000 | INHALATION_SPRAY | Freq: Four times a day (QID) | RESPIRATORY_TRACT | 0 refills | Status: DC | PRN

## 2017-11-18 MED ORDER — AZITHROMYCIN 250 MG PO TABS: ORAL_TABLET | ORAL | 0 refills | Status: DC

## 2017-11-18 NOTE — Patient Instructions (Signed)

## 2017-11-18 NOTE — Progress Notes (Signed)
Patient: Brandi Guzman Female    DOB: 1961/05/06   57 y.o.   MRN: [id number] Visit Date: 11/18/2017  Today's Provider: Margaretann Loveless, PA-C   Chief Complaint  Patient presents with  . URI   Subjective:    HPI Upper Respiratory Infection: Patient complains of symptoms of a URI. Symptoms include congestion and cough. Onset of symptoms was 10 days ago, gradually worsening since that time. She also c/o bilateral ear pressure/pain, productive cough with  green colored sputum, shortness of breath, sore throat and wheezing for the past 3 days .  She is drinking plenty of fluids. Evaluation to date: none. Treatment to date: antihistamines, cough suppressants and decongestants.     No Known Allergies   Current Outpatient Medications:  .  BIOTIN PO, Take by mouth., Disp: , Rfl:  .  MULTIPLE VITAMIN PO, Take 1 tablet by mouth daily., Disp: , Rfl:  .  PREMPRO 0.625-5 MG tablet, take 1 tablet by mouth once daily, Disp: 84 tablet, Rfl: 3 .  Probiotic Product (PROBIOTIC DAILY PO), Take 1 capsule by mouth daily., Disp: , Rfl:   Review of Systems  Constitutional: Negative.   HENT: Positive for congestion, ear pain, sinus pressure and sore throat.   Respiratory: Positive for cough, shortness of breath and wheezing.   Cardiovascular: Negative.   Neurological: Negative.     Social History   Tobacco Use  . Smoking status: Never Smoker  . Smokeless tobacco: Never Used  Substance Use Topics  . Alcohol use: Yes    Comment: Occasional alcohol use   Objective:   BP 130/80 (BP Location: Left Arm, Patient Position: Sitting, Cuff Size: Normal)   Pulse 84   Temp 98.3 F (36.8 C) (Oral)   Resp 16   Wt 115 lb 12.8 oz (52.5 kg)   SpO2 98%   BMI 19.88 kg/m  Vitals:   11/18/17 1601  BP: 130/80  Pulse: 84  Resp: 16  Temp: 98.3 F (36.8 C)  TempSrc: Oral  SpO2: 98%  Weight: 115 lb 12.8 oz (52.5 kg)     Physical Exam  Constitutional: She appears well-developed and  well-nourished. No distress.  HENT:  Head: Normocephalic and atraumatic.  Right Ear: Hearing, tympanic membrane, external ear and ear canal normal.  Left Ear: Hearing, tympanic membrane, external ear and ear canal normal.  Nose: Nose normal.  Mouth/Throat: Uvula is midline, oropharynx is clear and moist and mucous membranes are normal. No oropharyngeal exudate.  Eyes: Pupils are equal, round, and reactive to light. Conjunctivae are normal. Right eye exhibits no discharge. Left eye exhibits no discharge. No scleral icterus.  Neck: Normal range of motion. Neck supple. No tracheal deviation present. No thyromegaly present.  Cardiovascular: Normal rate, regular rhythm and normal heart sounds. Exam reveals no gallop and no friction rub.  No murmur heard. Pulmonary/Chest: Effort normal. No stridor. No respiratory distress. She has wheezes (fine expiratory). She has no rales.  Lymphadenopathy:    She has no cervical adenopathy.  Skin: Skin is warm and dry. She is not diaphoretic.  Vitals reviewed.       Assessment & Plan:     1. Bronchitis Worsening despite OTC treatment x 10 days. Will give Zpak, prednisone and albuterol inhaler as below. She is to call if symptoms worsen.  - azithromycin (ZITHROMAX) 250 MG tablet; Take 2 tablets PO on day one, and one tablet PO daily thereafter until completed.  Dispense: 6 tablet; Refill: 0 -  predniSONE (STERAPRED UNI-PAK 21 TAB) 10 MG (21) TBPK tablet; 6 day taper; take as directed on package instructions  Dispense: 21 tablet; Refill: 0 - albuterol (PROVENTIL HFA;VENTOLIN HFA) 108 (90 Base) MCG/ACT inhaler; Inhale 2 puffs into the lungs every 6 (six) hours as needed for wheezing or shortness of breath.  Dispense: 1 Inhaler; Refill: 0       Margaretann Loveless, PA-C  [facility]

## 2018-03-22 ENCOUNTER — Ambulatory Visit: Payer: BLUE CROSS/BLUE SHIELD | Admitting: Physician Assistant

## 2018-03-22 ENCOUNTER — Encounter: Payer: Self-pay | Admitting: Physician Assistant

## 2018-03-22 VITALS — BP 110/70 | HR 58 | Temp 98.0°F | Resp 16 | Wt 118.2 lb

## 2018-03-22 DIAGNOSIS — J0140 Acute pansinusitis, unspecified: Secondary | ICD-10-CM

## 2018-03-22 DIAGNOSIS — J3089 Other allergic rhinitis: Secondary | ICD-10-CM

## 2018-03-22 MED ORDER — AZITHROMYCIN 250 MG PO TABS: ORAL_TABLET | ORAL | 0 refills | Status: DC

## 2018-03-22 MED ORDER — FLUTICASONE FUROATE 27.5 MCG/SPRAY NA SUSP: 2.0000 | Freq: Every day | NASAL | 12 refills | Status: DC

## 2018-03-22 NOTE — Progress Notes (Signed)
Patient: Brandi Guzman Female    DOB: 1961/02/10   57 y.o.   MRN: [id number] Visit Date: 03/22/2018  Today's Provider: Margaretann Loveless, PA-C   Chief Complaint  Patient presents with  . URI   Subjective:    URI   This is a new problem. The problem has been unchanged. There has been no fever. Associated symptoms include congestion, a plugged ear sensation and sinus pain. Pertinent negatives include no chest pain, coughing, ear pain, headaches or wheezing. She has tried decongestant ("Periodically" Reports this weekend she took Amox from 2014) for the symptoms.      No Known Allergies   Current Outpatient Medications:  .  BIOTIN PO, Take by mouth., Disp: , Rfl:  .  MULTIPLE VITAMIN PO, Take 1 tablet by mouth daily., Disp: , Rfl:  .  PREMPRO 0.625-5 MG tablet, take 1 tablet by mouth once daily, Disp: 84 tablet, Rfl: 3 .  Probiotic Product (PROBIOTIC DAILY PO), Take 1 capsule by mouth daily., Disp: , Rfl:  .  albuterol (PROVENTIL HFA;VENTOLIN HFA) 108 (90 Base) MCG/ACT inhaler, Inhale 2 puffs into the lungs every 6 (six) hours as needed for wheezing or shortness of breath. (Patient not taking: Reported on 03/22/2018), Disp: 1 Inhaler, Rfl: 0 .  azithromycin (ZITHROMAX) 250 MG tablet, Take 2 tablets PO on day one, and one tablet PO daily thereafter until completed. (Patient not taking: Reported on 03/22/2018), Disp: 6 tablet, Rfl: 0 .  predniSONE (STERAPRED UNI-PAK 21 TAB) 10 MG (21) TBPK tablet, 6 day taper; take as directed on package instructions (Patient not taking: Reported on 03/22/2018), Disp: 21 tablet, Rfl: 0  Review of Systems  Constitutional: Positive for chills. Negative for fever.  HENT: Positive for congestion, postnasal drip, sinus pressure, sinus pain and voice change. Negative for ear pain.   Respiratory: Negative for cough, chest tightness, shortness of breath and wheezing.   Cardiovascular: Negative for chest pain, palpitations and leg swelling.  Gastrointestinal:  Negative.   Neurological: Negative for dizziness and headaches.    Social History   Tobacco Use  . Smoking status: Never Smoker  . Smokeless tobacco: Never Used  Substance Use Topics  . Alcohol use: Yes    Comment: Occasional alcohol use   Objective:   BP 110/70 (BP Location: Left Arm, Patient Position: Sitting, Cuff Size: Normal)   Pulse (!) 58   Temp 98 F (36.7 C) (Oral)   Resp 16   Wt 118 lb 3.2 oz (53.6 kg)   SpO2 97%   BMI 20.29 kg/m  Vitals:   03/22/18 1549  BP: 110/70  Pulse: (!) 58  Resp: 16  Temp: 98 F (36.7 C)  TempSrc: Oral  SpO2: 97%  Weight: 118 lb 3.2 oz (53.6 kg)     Physical Exam  Constitutional: She appears well-developed and well-nourished. No distress.  HENT:  Head: Normocephalic and atraumatic.  Right Ear: Hearing, tympanic membrane, external ear and ear canal normal.  Left Ear: Hearing, tympanic membrane, external ear and ear canal normal.  Nose: Right sinus exhibits maxillary sinus tenderness and frontal sinus tenderness. Left sinus exhibits maxillary sinus tenderness and frontal sinus tenderness.  Mouth/Throat: Uvula is midline, oropharynx is clear and moist and mucous membranes are normal. No oropharyngeal exudate.  Neck: Normal range of motion. Neck supple. No tracheal deviation present. No thyromegaly present.  Cardiovascular: Normal rate, regular rhythm and normal heart sounds. Exam reveals no gallop and no friction rub.  No murmur  heard. Pulmonary/Chest: Effort normal and breath sounds normal. No stridor. No respiratory distress. She has no wheezes. She has no rales.  Lymphadenopathy:    She has no cervical adenopathy.  Skin: She is not diaphoretic.  Vitals reviewed.      Assessment & Plan:     1. Acute pansinusitis, recurrence not specified Worsening symptoms that have not responded to OTC medications. Will give augmentin as below. Continue allergy medications. Stay well hydrated and get plenty of rest. Also advised patient to  NEVER take old antibiotics and to make sure to complete courses when they are given to reduce development of antibiotic resistance. Call if no symptom improvement or if symptoms worsen. - azithromycin (ZITHROMAX) 250 MG tablet; Take 2 tablets PO on day one, and one tablet PO daily thereafter until completed.  Dispense: 6 tablet; Refill: 0  2. Seasonal allergic rhinitis due to fungal spores - fluticasone (FLONASE SENSIMIST) 27.5 MCG/SPRAY nasal spray; Place 2 sprays into the nose daily.  Dispense: 10 g; Refill: 12       Margaretann Loveless, PA-C  [facility]

## 2018-03-26 ENCOUNTER — Ambulatory Visit: Payer: Self-pay | Admitting: Physician Assistant

## 2018-03-29 ENCOUNTER — Encounter: Payer: Self-pay | Admitting: Physician Assistant

## 2018-06-26 ENCOUNTER — Other Ambulatory Visit: Payer: Self-pay | Admitting: Physician Assistant

## 2018-06-26 DIAGNOSIS — R5383 Other fatigue: Secondary | ICD-10-CM

## 2018-06-26 DIAGNOSIS — L659 Nonscarring hair loss, unspecified: Secondary | ICD-10-CM

## 2018-07-07 DIAGNOSIS — H5213 Myopia, bilateral: Secondary | ICD-10-CM | POA: Diagnosis not present

## 2018-09-05 ENCOUNTER — Encounter: Payer: Self-pay | Admitting: Physician Assistant

## 2018-09-05 ENCOUNTER — Ambulatory Visit: Payer: BLUE CROSS/BLUE SHIELD | Admitting: Physician Assistant

## 2018-09-05 VITALS — BP 140/80 | HR 64 | Temp 98.5°F | Resp 16 | Wt 118.4 lb

## 2018-09-05 DIAGNOSIS — J40 Bronchitis, not specified as acute or chronic: Secondary | ICD-10-CM

## 2018-09-05 DIAGNOSIS — J069 Acute upper respiratory infection, unspecified: Secondary | ICD-10-CM | POA: Diagnosis not present

## 2018-09-05 MED ORDER — PREDNISONE 20 MG PO TABS: 20.0000 mg | ORAL_TABLET | Freq: Every day | ORAL | 0 refills | Status: DC

## 2018-09-05 MED ORDER — AZITHROMYCIN 250 MG PO TABS: ORAL_TABLET | ORAL | 0 refills | Status: DC

## 2018-09-05 NOTE — Progress Notes (Signed)
Patient: Brandi Guzman Female    DOB: 03/12/1961   58 y.o.   MRN: [id number] Visit Date: 09/05/2018  Today's Provider: Margaretann Loveless, PA-C   Chief Complaint  Patient presents with  . URI   Subjective:     URI   This is a new problem. The current episode started in the past 7 days (Last Monday started with ache fever chills.). The problem has been gradually worsening. Maximum temperature: Low grade 99.6-100. The fever has been present for 5 days or more. Associated symptoms include congestion, coughing, ear pain, rhinorrhea, a sore throat ("not much" just with the drainage) and wheezing ("some"). Pertinent negatives include no chest pain, headaches or sinus pain. Treatments tried: Mucinex with acetaminophen and Sudafed. The treatment provided no relief.    No Known Allergies   Current Outpatient Medications:  .  BIOTIN PO, Take by mouth., Disp: , Rfl:  .  MULTIPLE VITAMIN PO, Take 1 tablet by mouth daily., Disp: , Rfl:  .  PREMPRO 0.625-5 MG tablet, TAKE 1 TABLET BY MOUTH ONCE DALIY, Disp: 84 tablet, Rfl: 1 .  Probiotic Product (PROBIOTIC DAILY PO), Take 1 capsule by mouth daily., Disp: , Rfl:  .  albuterol (PROVENTIL HFA;VENTOLIN HFA) 108 (90 Base) MCG/ACT inhaler, Inhale 2 puffs into the lungs every 6 (six) hours as needed for wheezing or shortness of breath. (Patient not taking: Reported on 03/22/2018), Disp: 1 Inhaler, Rfl: 0 .  fluticasone (FLONASE SENSIMIST) 27.5 MCG/SPRAY nasal spray, Place 2 sprays into the nose daily. (Patient not taking: Reported on 09/05/2018), Disp: 10 g, Rfl: 12  Review of Systems  Constitutional: Positive for chills and fever.  HENT: Positive for congestion, ear pain, postnasal drip, rhinorrhea, sore throat ("not much" just with the drainage) and voice change. Negative for sinus pressure, sinus pain and trouble swallowing.   Respiratory: Positive for cough and wheezing ("some"). Negative for chest tightness and shortness of breath.     Cardiovascular: Negative for chest pain, palpitations and leg swelling.  Neurological: Negative for headaches.    Social History   Tobacco Use  . Smoking status: Never Smoker  . Smokeless tobacco: Never Used  Substance Use Topics  . Alcohol use: Yes    Comment: Occasional alcohol use      Objective:   BP 140/80 (BP Location: Left Arm, Patient Position: Sitting, Cuff Size: Normal)   Pulse 64   Temp 98.5 F (36.9 C) (Oral)   Resp 16   Wt 118 lb 6.4 oz (53.7 kg)   SpO2 97%   BMI 20.32 kg/m  Vitals:   09/05/18 1047  BP: 140/80  Pulse: 64  Resp: 16  Temp: 98.5 F (36.9 C)  TempSrc: Oral  SpO2: 97%  Weight: 118 lb 6.4 oz (53.7 kg)     Physical Exam Vitals signs reviewed.  Constitutional:      General: She is not in acute distress.    Appearance: Normal appearance. She is well-developed. She is not ill-appearing or diaphoretic.  HENT:     Head: Normocephalic and atraumatic.     Right Ear: Hearing, tympanic membrane, ear canal and external ear normal.     Left Ear: Hearing, tympanic membrane, ear canal and external ear normal.     Nose: Nose normal.     Mouth/Throat:     Mouth: Mucous membranes are moist.     Pharynx: Uvula midline. No oropharyngeal exudate.  Eyes:     General: No scleral icterus.  Right eye: No discharge.        Left eye: No discharge.     Conjunctiva/sclera: Conjunctivae normal.     Pupils: Pupils are equal, round, and reactive to light.  Neck:     Musculoskeletal: Normal range of motion and neck supple.     Thyroid: No thyromegaly.     Trachea: No tracheal deviation.  Cardiovascular:     Rate and Rhythm: Normal rate and regular rhythm.     Heart sounds: Normal heart sounds. No murmur. No friction rub. No gallop.   Pulmonary:     Effort: Pulmonary effort is normal. No respiratory distress.     Breath sounds: No stridor. Examination of the right-upper field reveals wheezing. Examination of the right-lower field reveals wheezing.  Wheezing present. No rales.  Lymphadenopathy:     Cervical: No cervical adenopathy.  Skin:    General: Skin is warm and dry.  Neurological:     Mental Status: She is alert.        Assessment & Plan    1. Upper respiratory tract infection, unspecified type Worsening. Will treat with zpak, prednisone burst. Continue Mucinex DM prn. Use albuterol inhaler prn. Push fluids. Rest. Call if worsening.  - azithromycin (ZITHROMAX) 250 MG tablet; Take 2 tablets PO on day one, and one tablet PO daily thereafter until completed.  Dispense: 6 tablet; Refill: 0 - predniSONE (DELTASONE) 20 MG tablet; Take 1 tablet (20 mg total) by mouth daily with breakfast.  Dispense: 3 tablet; Refill: 0  2. Bronchitis See above medical treatment plan. - azithromycin (ZITHROMAX) 250 MG tablet; Take 2 tablets PO on day one, and one tablet PO daily thereafter until completed.  Dispense: 6 tablet; Refill: 0 - predniSONE (DELTASONE) 20 MG tablet; Take 1 tablet (20 mg total) by mouth daily with breakfast.  Dispense: 3 tablet; Refill: 0     Margaretann Loveless, PA-C  [facility]

## 2018-09-05 NOTE — Patient Instructions (Signed)
Acute Bronchitis, Adult Acute bronchitis is when air tubes (bronchi) in the lungs suddenly get swollen. The condition can make it hard to breathe. It can also cause these symptoms:  A cough.  Coughing up clear, yellow, or green mucus.  Wheezing.  Chest congestion.  Shortness of breath.  A fever.  Body aches.  Chills.  A sore throat. Follow these instructions at home:  Medicines  Take over-the-counter and prescription medicines only as told by your doctor.  If you were prescribed an antibiotic medicine, take it as told by your doctor. Do not stop taking the antibiotic even if you start to feel better. General instructions  Rest.  Drink enough fluids to keep your pee (urine) pale yellow.  Avoid smoking and secondhand smoke. If you smoke and you need help quitting, ask your doctor. Quitting will help your lungs heal faster.  Use an inhaler, cool mist vaporizer, or humidifier as told by your doctor.  Keep all follow-up visits as told by your doctor. This is important. How is this prevented? To lower your risk of getting this condition again:  Wash your hands often with soap and water. If you cannot use soap and water, use hand sanitizer.  Avoid contact with people who have cold symptoms.  Try not to touch your hands to your mouth, nose, or eyes.  Make sure to get the flu shot every year. Contact a doctor if:  Your symptoms do not get better in 2 weeks. Get help right away if:  You cough up blood.  You have chest pain.  You have very bad shortness of breath.  You become dehydrated.  You faint (pass out) or keep feeling like you are going to pass out.  You keep throwing up (vomiting).  You have a very bad headache.  Your fever or chills gets worse. This information is not intended to replace advice given to you by your health care provider. Make sure you discuss any questions you have with your health care provider. Document Released: 12/23/2007 Document  Revised: 02/17/2017 Document Reviewed: 12/25/2015 Elsevier Interactive Patient Education  2019 [employer]  

## 2019-01-16 ENCOUNTER — Other Ambulatory Visit: Payer: Self-pay | Admitting: Physician Assistant

## 2019-01-16 DIAGNOSIS — R5383 Other fatigue: Secondary | ICD-10-CM

## 2019-01-16 DIAGNOSIS — L659 Nonscarring hair loss, unspecified: Secondary | ICD-10-CM

## 2019-01-16 NOTE — Telephone Encounter (Signed)
Please advise refill? 

## 2019-03-22 ENCOUNTER — Other Ambulatory Visit: Payer: Self-pay

## 2019-03-22 DIAGNOSIS — R6889 Other general symptoms and signs: Secondary | ICD-10-CM | POA: Diagnosis not present

## 2019-03-22 DIAGNOSIS — Z20822 Contact with and (suspected) exposure to covid-19: Secondary | ICD-10-CM

## 2019-03-23 LAB — NOVEL CORONAVIRUS, NAA: SARS-CoV-2, NAA: NOT DETECTED

## 2019-07-03 ENCOUNTER — Other Ambulatory Visit: Payer: Self-pay | Admitting: Physician Assistant

## 2019-07-03 DIAGNOSIS — L659 Nonscarring hair loss, unspecified: Secondary | ICD-10-CM

## 2019-07-03 DIAGNOSIS — R5383 Other fatigue: Secondary | ICD-10-CM

## 2019-07-03 NOTE — Telephone Encounter (Signed)
Due for CPE

## 2019-07-03 NOTE — Telephone Encounter (Signed)
Requested medication (s) are due for refill today: yes  Requested medication (s) are on the active medication list: yes  Last refill:  04/06/2019  Future visit scheduled: no  Notes to clinic:  LOV- 09/05/2018 Review for refill   Requested Prescriptions  Pending Prescriptions Disp Refills   PREMPRO 0.625-5 MG tablet [Pharmacy Med Name: PREMPRO 0.625MG /5MG  TABS 28 (BLUE)] 84 tablet 1    Sig: TAKE 1 TABLET BY MOUTH ONCE DALIY      OB/GYN:  Hormone Combinations Failed - 07/03/2019  3:30 AM      Failed - Mammogram is up-to-date per Health Maintenance      Failed - Last BP in normal range    BP Readings from Last 1 Encounters:  09/05/18 140/80          Passed - Valid encounter within last 12 months    Recent Outpatient Visits           10 months ago Upper respiratory tract infection, unspecified type   [place], [place], [place]   1 year ago Acute pansinusitis, recurrence not specified   [place], [place], [place]   1 year ago [place], [place], [place]   1 year ago Annual physical exam   [facility], [place], [place]   3 years ago Annual physical exam   [facility] [place], [place], [place]

## 2019-07-04 NOTE — Telephone Encounter (Signed)
LMTCB OK for [facility] to schedule Physical

## 2019-09-23 ENCOUNTER — Other Ambulatory Visit: Payer: Self-pay | Admitting: Physician Assistant

## 2019-09-23 DIAGNOSIS — L659 Nonscarring hair loss, unspecified: Secondary | ICD-10-CM

## 2019-09-23 DIAGNOSIS — R5383 Other fatigue: Secondary | ICD-10-CM

## 2019-09-23 NOTE — Telephone Encounter (Signed)
Requested medications are due for refill today? Yes  Requested medications are on active medication list? Yes  Last Refill:   07/03/2019  # 84 with no refills - Patient was overdue for physical when this refill was provided.    Future visit scheduled?  NO  Notes to Clinic:  Protocol failed, as patient is overdue for physical and mammogram.

## 2019-09-25 NOTE — Telephone Encounter (Signed)
Needs CPE 

## 2019-09-26 NOTE — Telephone Encounter (Signed)
LMTCB 09/26/2019.  PEC please schedule CPE when pt calls back.   Thanks,   -Vernona Rieger

## 2020-10-09 NOTE — Progress Notes (Signed)
Complete physical exam   Patient: Brandi Guzman   DOB: 1961-01-17   60 y.o. Female  MRN: [id number] Visit Date: 10/10/2020  Today's healthcare provider: Margaretann Loveless, PA-C   Chief Complaint  Patient presents with  . Annual Exam   Subjective    Brandi Guzman is a 60 y.o. female who presents today for a complete physical exam.  She reports consuming a general diet. The patient does not participate in regular exercise at present. She generally feels well. She reports sleeping well. She does not have additional problems to discuss today.   Last Pap: 08/17/2017 Normal/ HPV negative Last Mammogram: 07/06/2013 Bi-RADS CAT 1 OC light: 08/17/2017 negative  HPI   No past medical history on file. Past Surgical History:  Procedure Laterality Date  . APPENDECTOMY  09/30/2010  . OVARIAN CYST REMOVAL Left 1993   Social History   Socioeconomic History  . Marital status: Married    Spouse name: Casimiro Needle  . Number of children: 2  . Years of education: College  . Highest education level: Not on file  Occupational History    Comment: Full-Time  Tobacco Use  . Smoking status: Never Smoker  . Smokeless tobacco: Never Used  Substance and Sexual Activity  . Alcohol use: Yes    Comment: Occasional alcohol use  . Drug use: No  . Sexual activity: Not on file  Other Topics Concern  . Not on file  Social History Narrative  . Not on file   Social Determinants of Health   Financial Resource Strain: Not on file  Food Insecurity: Not on file  Transportation Needs: Not on file  Physical Activity: Not on file  Stress: Not on file  Social Connections: Not on file  Intimate Partner Violence: Not on file   Family Status  Relation Name Status  . Mother  Alive  . Father  Deceased at age 62       Recenlty past away early 2016  . Brother  Alive   Family History  Problem Relation Age of Onset  . Diabetes Mother        DM; Type 2  . COPD Mother   . Stroke Mother   . CAD  Father    No Known Allergies  Patient Care Team: Reine Just as PCP - General (Family Medicine)   Medications: Outpatient Medications Prior to Visit  Medication Sig  . BIOTIN PO Take by mouth.  . MULTIPLE VITAMIN PO Take 1 tablet by mouth daily.  . Probiotic Product (PROBIOTIC DAILY PO) Take 1 capsule by mouth daily.  . [DISCONTINUED] albuterol (PROVENTIL HFA;VENTOLIN HFA) 108 (90 Base) MCG/ACT inhaler Inhale 2 puffs into the lungs every 6 (six) hours as needed for wheezing or shortness of breath. (Patient not taking: No sig reported)  . [DISCONTINUED] azithromycin (ZITHROMAX) 250 MG tablet Take 2 tablets PO on day one, and one tablet PO daily thereafter until completed. (Patient not taking: Reported on 10/10/2020)  . [DISCONTINUED] fluticasone (FLONASE SENSIMIST) 27.5 MCG/SPRAY nasal spray Place 2 sprays into the nose daily. (Patient not taking: No sig reported)  . [DISCONTINUED] predniSONE (DELTASONE) 20 MG tablet Take 1 tablet (20 mg total) by mouth daily with breakfast. (Patient not taking: Reported on 10/10/2020)  . [DISCONTINUED] PREMPRO 0.625-5 MG tablet TAKE 1 TABLET BY MOUTH ONCE DALIY (Patient not taking: Reported on 10/10/2020)   No facility-administered medications prior to visit.    Review of Systems  Constitutional: Negative for chills, fatigue  and fever.  HENT: Negative for congestion, ear pain, rhinorrhea, sneezing and sore throat.   Eyes: Negative.  Negative for pain and redness.  Respiratory: Negative for cough, shortness of breath and wheezing.   Cardiovascular: Negative for chest pain and leg swelling.  Gastrointestinal: Negative for abdominal pain, blood in stool, constipation, diarrhea and nausea.  Endocrine: Negative for polydipsia and polyphagia.  Genitourinary: Negative.  Negative for dysuria, flank pain, hematuria, pelvic pain, vaginal bleeding and vaginal discharge.  Musculoskeletal: Negative for arthralgias, back pain, gait problem and joint  swelling.  Skin: Negative for rash.  Neurological: Negative.  Negative for dizziness, tremors, seizures, weakness, light-headedness, numbness and headaches.  Hematological: Negative for adenopathy.  Psychiatric/Behavioral: Negative.  Negative for behavioral problems, confusion and dysphoric mood. The patient is not nervous/anxious and is not hyperactive.     Last CBC Lab Results  Component Value Date   WBC 5.2 10/10/2020   HGB 14.3 10/10/2020   HCT 43.2 10/10/2020   MCV 91 10/10/2020   MCH 30.2 10/10/2020   RDW 11.8 10/10/2020   PLT 184 10/10/2020   Last metabolic panel Lab Results  Component Value Date   GLUCOSE 89 10/10/2020   NA 145 (H) 10/10/2020   K 5.2 10/10/2020   CL 104 10/10/2020   CO2 24 10/10/2020   BUN 16 10/10/2020   CREATININE 0.81 10/10/2020   GFRNONAA 86 08/17/2017   GFRAA 99 08/17/2017   CALCIUM 10.1 10/10/2020   PROT 6.9 10/10/2020   ALBUMIN 4.3 10/10/2020   LABGLOB 2.6 10/10/2020   AGRATIO 1.7 10/10/2020   BILITOT 0.3 10/10/2020   ALKPHOS 92 10/10/2020   AST 12 10/10/2020   ALT 24 10/10/2020      Objective    BP 108/71 (BP Location: Left Arm)   Pulse 68   Temp 97.7 F (36.5 C) (Temporal)   Resp 14   Ht 5\' 4"  (1.626 m)   Wt 120 lb 6.4 oz (54.6 kg)   BMI 20.67 kg/m  BP Readings from Last 3 Encounters:  10/10/20 108/71  09/05/18 140/80  03/22/18 110/70   Wt Readings from Last 3 Encounters:  10/10/20 120 lb 6.4 oz (54.6 kg)  09/05/18 118 lb 6.4 oz (53.7 kg)  03/22/18 118 lb 3.2 oz (53.6 kg)     Physical Exam Vitals reviewed.  Constitutional:      General: She is not in acute distress.    Appearance: Normal appearance. She is well-developed and normal weight. She is not ill-appearing or diaphoretic.  HENT:     Head: Normocephalic and atraumatic.     Right Ear: Hearing, tympanic membrane, ear canal and external ear normal.     Left Ear: Hearing, tympanic membrane, ear canal and external ear normal.     Nose: Nose normal.      Mouth/Throat:     Mouth: Mucous membranes are moist.     Pharynx: Oropharynx is clear. Uvula midline. No oropharyngeal exudate.  Eyes:     General: No scleral icterus.       Right eye: No discharge.        Left eye: No discharge.     Extraocular Movements: Extraocular movements intact.     Conjunctiva/sclera: Conjunctivae normal.     Pupils: Pupils are equal, round, and reactive to light.  Neck:     Thyroid: No thyromegaly.     Vascular: No carotid bruit or JVD.     Trachea: No tracheal deviation.  Cardiovascular:     Rate and Rhythm: Normal  rate and regular rhythm.     Pulses: Normal pulses.     Heart sounds: Normal heart sounds. No murmur heard. No friction rub. No gallop.   Pulmonary:     Effort: Pulmonary effort is normal. No respiratory distress.     Breath sounds: Normal breath sounds. No wheezing or rales.  Chest:     Chest wall: No tenderness.  Breasts: Breasts are symmetrical.     Right: No inverted nipple, mass, nipple discharge, skin change or tenderness.     Left: No inverted nipple, mass, nipple discharge, skin change or tenderness.    Abdominal:     General: Abdomen is flat. Bowel sounds are normal. There is no distension.     Palpations: Abdomen is soft. There is no mass.     Tenderness: There is no abdominal tenderness. There is no guarding or rebound.     Hernia: There is no hernia in the left inguinal area.  Genitourinary:    General: Normal vulva.     Exam position: Supine.     Labia:        Right: No rash, tenderness, lesion or injury.        Left: No rash, tenderness, lesion or injury.      Vagina: Normal. No signs of injury. No vaginal discharge, erythema, tenderness or bleeding.     Cervix: No cervical motion tenderness, discharge or friability.     Adnexa:        Right: No mass, tenderness or fullness.         Left: No mass, tenderness or fullness.       Rectum: Normal.  Musculoskeletal:        General: No tenderness. Normal range of motion.      Cervical back: Normal range of motion and neck supple. No tenderness.     Right lower leg: No edema.     Left lower leg: No edema.  Lymphadenopathy:     Cervical: No cervical adenopathy.  Skin:    General: Skin is warm and dry.     Capillary Refill: Capillary refill takes less than 2 seconds.     Findings: No rash.  Neurological:     General: No focal deficit present.     Mental Status: She is alert and oriented to person, place, and time. Mental status is at baseline.     Cranial Nerves: No cranial nerve deficit.     Coordination: Coordination normal.     Deep Tendon Reflexes: Reflexes are normal and symmetric.  Psychiatric:        Mood and Affect: Mood normal.        Behavior: Behavior normal.        Thought Content: Thought content normal.        Judgment: Judgment normal.     Last depression screening scores PHQ 2/9 Scores 10/10/2020 08/17/2017 06/17/2015  PHQ - 2 Score 0 0 -  PHQ- 9 Score 0 - -  Exception Documentation - - Patient refusal   Last fall risk screening Fall Risk  10/10/2020  Falls in the past year? 0  Number falls in past yr: 0  Injury with Fall? 0  Follow up Falls evaluation completed   Last Audit-C alcohol use screening Alcohol Use Disorder Test (AUDIT) 10/10/2020  1. How often do you have a drink containing alcohol? 0  2. How many drinks containing alcohol do you have on a typical day when you are drinking? 0  3. How often do you  have six or more drinks on one occasion? 0  AUDIT-C Score 0  Alcohol Brief Interventions/Follow-up AUDIT Score <7 follow-up not indicated   A score of 3 or more in women, and 4 or more in men indicates increased risk for alcohol abuse, EXCEPT if all of the points are from question 1   No results found for any visits on 10/10/20.  Assessment & Plan    Routine Health Maintenance and Physical Exam  Exercise Activities and Dietary recommendations Goals    . Exercise 150 minutes per week (moderate activity)        Immunization History  Administered Date(s) Administered  . Influenza,inj,Quad PF,6+ Mos 07/06/2014, 06/17/2015, 06/19/2016  . Td 05/29/2002  . Tdap 11/02/2011  . Zoster 07/06/2014    Health Maintenance  Topic Date Due  . COVID-19 Vaccine (1) Never done  . HIV Screening  Never done  . MAMMOGRAM  07/07/2015  . INFLUENZA VACCINE  02/18/2020  . PAP SMEAR-Modifier  08/17/2020  . TETANUS/TDAP  11/01/2021  . COLONOSCOPY (Pts 45-58yrs Insurance coverage will need to be confirmed)  05/20/2023  . Hepatitis C Screening  Completed  . HPV VACCINES  Aged Out    Discussed health benefits of physical activity, and encouraged her to engage in regular exercise appropriate for her age and condition.  1. Annual physical exam Normal physical exam today. Will check labs as below and f/u pending lab results. If labs are stable and WNL she will not need to have these rechecked for one year at her next annual physical exam. She is to call the office in the meantime if she has any acute issue, questions or concerns. - CBC w/Diff/Platelet - Comprehensive Metabolic Panel (CMET) - TSH - Lipid Panel With LDL/HDL Ratio - HgB A1c  2. Encounter for breast cancer screening using non-mammogram modality Mammogram ordered.   3. Cervical cancer screening Pap collected today. Will send as below and f/u pending results. - Cytology - PAP  4. Screening for HIV without presence of risk factors Will check labs as below and f/u pending results. - HIV antibody (with reflex)  5. Gross hematuria Noted a few days ago she had hematuria in her urine. Has been pushing fluids and symptoms improved. UA collected today and is normal.  - POCT Urinalysis Dipstick - Urinalysis, microscopic only  6. Sinus complaint Chronic issues. ENT referral placed for patient.  - Ambulatory referral to ENT   No follow-ups on file.     Delmer Islam, PA-C, have reviewed all documentation for this visit. The  documentation on 10/25/20 for the exam, diagnosis, procedures, and orders are all accurate and complete.   Reine Just  [facility] [phone] (phone) [phone] (fax)  [facility]

## 2020-10-10 ENCOUNTER — Other Ambulatory Visit: Payer: Self-pay

## 2020-10-10 ENCOUNTER — Ambulatory Visit (INDEPENDENT_AMBULATORY_CARE_PROVIDER_SITE_OTHER): Payer: BLUE CROSS/BLUE SHIELD | Admitting: Physician Assistant

## 2020-10-10 ENCOUNTER — Encounter: Payer: Self-pay | Admitting: Physician Assistant

## 2020-10-10 ENCOUNTER — Other Ambulatory Visit (HOSPITAL_COMMUNITY)
Admission: RE | Admit: 2020-10-10 | Discharge: 2020-10-10 | Disposition: A | Discharge status: Home / Self Care | Payer: BLUE CROSS/BLUE SHIELD | Source: Ambulatory Visit | Attending: Physician Assistant | Admitting: Physician Assistant

## 2020-10-10 VITALS — BP 108/71 | HR 68 | Temp 97.7°F | Resp 14 | Ht 64.0 in | Wt 120.4 lb

## 2020-10-10 DIAGNOSIS — Z124 Encounter for screening for malignant neoplasm of cervix: Secondary | ICD-10-CM | POA: Insufficient documentation

## 2020-10-10 DIAGNOSIS — Z1239 Encounter for other screening for malignant neoplasm of breast: Secondary | ICD-10-CM | POA: Diagnosis not present

## 2020-10-10 DIAGNOSIS — R310 Gross hematuria: Secondary | ICD-10-CM | POA: Diagnosis not present

## 2020-10-10 DIAGNOSIS — Z114 Encounter for screening for human immunodeficiency virus [HIV]: Secondary | ICD-10-CM

## 2020-10-10 DIAGNOSIS — R0989 Other specified symptoms and signs involving the circulatory and respiratory systems: Secondary | ICD-10-CM | POA: Diagnosis not present

## 2020-10-10 DIAGNOSIS — Z0000 Encounter for general adult medical examination without abnormal findings: Secondary | ICD-10-CM

## 2020-10-10 LAB — POCT URINALYSIS DIPSTICK
Bilirubin, UA: NEGATIVE
Glucose, UA: NEGATIVE
Ketones, UA: NEGATIVE
Leukocytes, UA: NEGATIVE
Nitrite, UA: NEGATIVE
Protein, UA: NEGATIVE
Spec Grav, UA: 1.01 (ref 1.010–1.025)
Urobilinogen, UA: 0.2 E.U./dL
pH, UA: 7 (ref 5.0–8.0)

## 2020-10-10 NOTE — Patient Instructions (Signed)
Preventive Care 84-59 Years Old, Female Preventive care refers to lifestyle choices and visits with your health care provider that can promote health and wellness. This includes:  A yearly physical exam. This is also called an annual wellness visit.  Regular dental and eye exams.  Immunizations.  Screening for certain conditions.  Healthy lifestyle choices, such as: ? Eating a healthy diet. ? Getting regular exercise. ? Not using drugs or products that contain nicotine and tobacco. ? Limiting alcohol use. What can I expect for my preventive care visit? Physical exam Your health care provider will check your:  Height and weight. These may be used to calculate your BMI (body mass index). BMI is a measurement that tells if you are at a healthy weight.  Heart rate and blood pressure.  Body temperature.  Skin for abnormal spots. Counseling Your health care provider may ask you questions about your:  Past medical problems.  Family's medical history.  Alcohol, tobacco, and drug use.  Emotional well-being.  Home life and relationship well-being.  Sexual activity.  Diet, exercise, and sleep habits.  Work and work Statistician.  Access to firearms.  Method of birth control.  Menstrual cycle.  Pregnancy history. What immunizations do I need? Vaccines are usually given at various ages, according to a schedule. Your health care provider will recommend vaccines for you based on your age, medical history, and lifestyle or other factors, such as travel or where you work.   What tests do I need? Blood tests  Lipid and cholesterol levels. These may be checked every 5 years, or more often if you are over 3 years old.  Hepatitis C test.  Hepatitis B test. Screening  Lung cancer screening. You may have this screening every year starting at age 73 if you have a 30-pack-year history of smoking and currently smoke or have quit within the past 15 years.  Colorectal cancer  screening. ? All adults should have this screening starting at age 52 and continuing until age 17. ? Your health care provider may recommend screening at age 49 if you are at increased risk. ? You will have tests every 1-10 years, depending on your results and the type of screening test.  Diabetes screening. ? This is done by checking your blood sugar (glucose) after you have not eaten for a while (fasting). ? You may have this done every 1-3 years.  Mammogram. ? This may be done every 1-2 years. ? Talk with your health care provider about when you should start having regular mammograms. This may depend on whether you have a family history of breast cancer.  BRCA-related cancer screening. This may be done if you have a family history of breast, ovarian, tubal, or peritoneal cancers.  Pelvic exam and Pap test. ? This may be done every 3 years starting at age 10. ? Starting at age 11, this may be done every 5 years if you have a Pap test in combination with an HPV test. Other tests  STD (sexually transmitted disease) testing, if you are at risk.  Bone density scan. This is done to screen for osteoporosis. You may have this scan if you are at high risk for osteoporosis. Talk with your health care provider about your test results, treatment options, and if necessary, the need for more tests. Follow these instructions at home: Eating and drinking  Eat a diet that includes fresh fruits and vegetables, whole grains, lean protein, and low-fat dairy products.  Take vitamin and mineral supplements  as recommended by your health care provider.  Do not drink alcohol if: ? Your health care provider tells you not to drink. ? You are pregnant, may be pregnant, or are planning to become pregnant.  If you drink alcohol: ? Limit how much you have to 0-1 drink a day. ? Be aware of how much alcohol is in your drink. In the [place] one drink equals one 12 oz bottle of beer (355 mL), one 5 oz glass of  wine (148 mL), or one 1 oz glass of hard liquor (44 mL).   Lifestyle  Take daily care of your teeth and gums. Brush your teeth every morning and night with fluoride toothpaste. Floss one time each day.  Stay active. Exercise for at least 30 minutes 5 or more days each week.  Do not use any products that contain nicotine or tobacco, such as cigarettes, e-cigarettes, and chewing tobacco. If you need help quitting, ask your health care provider.  Do not use drugs.  If you are sexually active, practice safe sex. Use a condom or other form of protection to prevent STIs (sexually transmitted infections).  If you do not wish to become pregnant, use a form of birth control. If you plan to become pregnant, see your health care provider for a prepregnancy visit.  If told by your health care provider, take low-dose aspirin daily starting at age 50.  Find healthy ways to cope with stress, such as: ? Meditation, yoga, or listening to music. ? Journaling. ? Talking to a trusted person. ? Spending time with friends and family. Safety  Always wear your seat belt while driving or riding in a vehicle.  Do not drive: ? If you have been drinking alcohol. Do not ride with someone who has been drinking. ? When you are tired or distracted. ? While texting.  Wear a helmet and other protective equipment during sports activities.  If you have firearms in your house, make sure you follow all gun safety procedures. What's next?  Visit your health care provider once a year for an annual wellness visit.  Ask your health care provider how often you should have your eyes and teeth checked.  Stay up to date on all vaccines. This information is not intended to replace advice given to you by your health care provider. Make sure you discuss any questions you have with your health care provider. Document Revised: 04/09/2020 Document Reviewed: 03/17/2018 Elsevier Patient Education  2021 Elsevier Inc.  

## 2020-10-11 LAB — TSH: TSH: 1.16 u[IU]/mL (ref 0.450–4.500)

## 2020-10-11 LAB — URINALYSIS, MICROSCOPIC ONLY
Bacteria, UA: NONE SEEN
Casts: NONE SEEN /lpf
Epithelial Cells (non renal): NONE SEEN /hpf (ref 0–10)
WBC, UA: NONE SEEN /hpf (ref 0–5)

## 2020-10-11 LAB — COMPREHENSIVE METABOLIC PANEL
ALT: 24 IU/L (ref 0–32)
AST: 12 IU/L (ref 0–40)
Albumin/Globulin Ratio: 1.7 (ref 1.2–2.2)
Albumin: 4.3 g/dL (ref 3.8–4.9)
Alkaline Phosphatase: 92 IU/L (ref 44–121)
BUN/Creatinine Ratio: 20 (ref 9–23)
BUN: 16 mg/dL (ref 6–24)
Bilirubin Total: 0.3 mg/dL (ref 0.0–1.2)
CO2: 24 mmol/L (ref 20–29)
Calcium: 10.1 mg/dL (ref 8.7–10.2)
Chloride: 104 mmol/L (ref 96–106)
Creatinine, Ser: 0.81 mg/dL (ref 0.57–1.00)
Globulin, Total: 2.6 g/dL (ref 1.5–4.5)
Glucose: 89 mg/dL (ref 65–99)
Potassium: 5.2 mmol/L (ref 3.5–5.2)
Sodium: 145 mmol/L — ABNORMAL HIGH (ref 134–144)
Total Protein: 6.9 g/dL (ref 6.0–8.5)
eGFR: 84 mL/min/{1.73_m2} (ref >59)

## 2020-10-11 LAB — CBC WITH DIFFERENTIAL/PLATELET
Basophils Absolute: 0 10*3/uL (ref 0.0–0.2)
Basos: 1 %
EOS (ABSOLUTE): 0.2 10*3/uL (ref 0.0–0.4)
Eos: 3 %
Hematocrit: 43.2 % (ref 34.0–46.6)
Hemoglobin: 14.3 g/dL (ref 11.1–15.9)
Immature Grans (Abs): 0 10*3/uL (ref 0.0–0.1)
Immature Granulocytes: 0 %
Lymphocytes Absolute: 2 10*3/uL (ref 0.7–3.1)
Lymphs: 38 %
MCH: 30.2 pg (ref 26.6–33.0)
MCHC: 33.1 g/dL (ref 31.5–35.7)
MCV: 91 fL (ref 79–97)
Monocytes Absolute: 0.4 10*3/uL (ref 0.1–0.9)
Monocytes: 7 %
Neutrophils Absolute: 2.6 10*3/uL (ref 1.4–7.0)
Neutrophils: 51 %
Platelets: 184 10*3/uL (ref 150–450)
RBC: 4.73 x10E6/uL (ref 3.77–5.28)
RDW: 11.8 % (ref 11.7–15.4)
WBC: 5.2 10*3/uL (ref 3.4–10.8)

## 2020-10-11 LAB — LIPID PANEL WITH LDL/HDL RATIO
Cholesterol, Total: 191 mg/dL (ref 100–199)
HDL: 70 mg/dL (ref >39)
LDL Chol Calc (NIH): 111 mg/dL — ABNORMAL HIGH (ref 0–99)
LDL/HDL Ratio: 1.6 ratio (ref 0.0–3.2)
Triglycerides: 51 mg/dL (ref 0–149)
VLDL Cholesterol Cal: 10 mg/dL (ref 5–40)

## 2020-10-11 LAB — HIV ANTIBODY (ROUTINE TESTING W REFLEX): HIV Screen 4th Generation wRfx: NONREACTIVE

## 2020-10-11 LAB — HEMOGLOBIN A1C
Est. average glucose Bld gHb Est-mCnc: 105 mg/dL
Hgb A1c MFr Bld: 5.3 % (ref 4.8–5.6)

## 2020-10-14 LAB — CYTOLOGY - PAP
Comment: NEGATIVE
High risk HPV: NEGATIVE

## 2020-10-25 ENCOUNTER — Encounter: Payer: Self-pay | Admitting: Physician Assistant

## 2020-12-06 ENCOUNTER — Telehealth: Payer: Self-pay | Admitting: Physician Assistant

## 2020-12-06 NOTE — Telephone Encounter (Signed)
Pt is calling regarding Billing DOS 10/10/20 with CPE & PAP.  Received bill from [facility] pathology. Received bill for $301.00. Was not processed through her insurance. Afterwards it was coded as service at outpatient procedure. Pt is calling to request if bill can be recoded as at part of her CPE at doctors office not as outpatient service CB- [phone]

## 2020-12-30 ENCOUNTER — Telehealth: Payer: Self-pay

## 2020-12-30 NOTE — Telephone Encounter (Signed)
Copied from CRM [id number]. Topic: Complaint - Sensitive >> Dec 30, 2020  9:27 AM Marylen Ponto wrote: Pt stated she has contacted [facility] billing and she was told to contact the office. Pt stated she needs billing to be corrected and  insisted on speaking with someone in the office. Pt declined to provide further details and requested that the person in charge return her call to resolve this issue. Cb# [phone]   Route to Research officer, political party.

## 2020-12-31 NOTE — Telephone Encounter (Signed)
Spoke to the patient and have emailed cytology.

## 2021-01-03 ENCOUNTER — Telehealth: Payer: BLUE CROSS/BLUE SHIELD | Admitting: Emergency Medicine

## 2021-01-03 DIAGNOSIS — R300 Dysuria: Secondary | ICD-10-CM

## 2021-01-03 MED ORDER — CEPHALEXIN 500 MG PO CAPS: 500.0000 mg | ORAL_CAPSULE | Freq: Two times a day (BID) | ORAL | 0 refills | Status: DC

## 2021-01-03 MED ORDER — FLUCONAZOLE 150 MG PO TABS: 150.0000 mg | ORAL_TABLET | Freq: Once | ORAL | 0 refills | Status: AC

## 2021-01-03 NOTE — Progress Notes (Signed)
We are sorry that you are not feeling well.  Here is how we plan to help!  Based on what you shared with me it looks like you most likely have a simple urinary tract infection.  A UTI (Urinary Tract Infection) is a bacterial infection of the bladder.  Most cases of urinary tract infections are simple to treat but a key part of your care is to encourage you to drink plenty of fluids and watch your symptoms carefully.  I have prescribed Keflex 500 mg twice a day for 7 days.  Your symptoms should gradually improve. Call us if the burning in your urine worsens, you develop worsening fever, back pain or pelvic pain or if your symptoms do not resolve after completing the antibiotic.  I've also sent 1 tablet of Diflucan for yeast infection.  Take this after you complete the antibiotic.  Urinary tract infections can be prevented by drinking plenty of water to keep your body hydrated.  Also be sure when you wipe, wipe from front to back and don't hold it in!  If possible, empty your bladder every 4 hours.  Your e-visit answers were reviewed by a board certified advanced clinical practitioner to complete your personal care plan.  Depending on the condition, your plan could have included both over the counter or prescription medications.  If there is a problem please reply  once you have received a response from your provider.  Your safety is important to [place].  If you have drug allergies check your prescription carefully.    You can use MyChart to ask questions about today's visit, request a non-urgent call back, or ask for a work or school excuse for 24 hours related to this e-Visit. If it has been greater than 24 hours you will need to follow up with your provider, or enter a new e-Visit to address those concerns.   You will get an e-mail in the next two days asking about your experience.  I hope that your e-visit has been valuable and will speed your recovery. Thank you for using  e-visits.  Approximately 5 minutes was used in reviewing the patient's chart, questionnaire, prescribing medications, and documentation.

## 2021-01-21 NOTE — Telephone Encounter (Signed)
01/16/21 Brandi Guzman with Cytology was able to contest charges and they were removed from patient's account.  Reached out to the patient and left a message.

## 2021-01-31 ENCOUNTER — Telehealth: Payer: BLUE CROSS/BLUE SHIELD | Admitting: Physician Assistant

## 2021-01-31 DIAGNOSIS — T3695XA Adverse effect of unspecified systemic antibiotic, initial encounter: Secondary | ICD-10-CM

## 2021-01-31 DIAGNOSIS — R3989 Other symptoms and signs involving the genitourinary system: Secondary | ICD-10-CM

## 2021-01-31 DIAGNOSIS — B379 Candidiasis, unspecified: Secondary | ICD-10-CM | POA: Diagnosis not present

## 2021-01-31 MED ORDER — CEPHALEXIN 500 MG PO CAPS: 500.0000 mg | ORAL_CAPSULE | Freq: Two times a day (BID) | ORAL | 0 refills | Status: AC

## 2021-01-31 MED ORDER — FLUCONAZOLE 150 MG PO TABS: 150.0000 mg | ORAL_TABLET | Freq: Once | ORAL | 0 refills | Status: AC

## 2021-01-31 NOTE — Progress Notes (Signed)

## 2021-05-16 ENCOUNTER — Ambulatory Visit: Payer: BLUE CROSS/BLUE SHIELD | Admitting: Family Medicine

## 2021-05-16 ENCOUNTER — Other Ambulatory Visit: Payer: Self-pay

## 2021-05-16 ENCOUNTER — Encounter: Payer: Self-pay | Admitting: Family Medicine

## 2021-05-16 VITALS — BP 140/80 | HR 87 | Temp 97.8°F | Resp 16 | Wt 117.8 lb

## 2021-05-16 DIAGNOSIS — M25551 Pain in right hip: Secondary | ICD-10-CM | POA: Diagnosis not present

## 2021-05-16 DIAGNOSIS — M25552 Pain in left hip: Secondary | ICD-10-CM | POA: Diagnosis not present

## 2021-05-16 DIAGNOSIS — M6281 Muscle weakness (generalized): Secondary | ICD-10-CM | POA: Diagnosis not present

## 2021-05-16 DIAGNOSIS — R5383 Other fatigue: Secondary | ICD-10-CM | POA: Diagnosis not present

## 2021-05-16 LAB — POCT URINALYSIS DIPSTICK
Bilirubin, UA: NEGATIVE
Blood, UA: NEGATIVE
Glucose, UA: NEGATIVE
Ketones, UA: NEGATIVE
Leukocytes, UA: NEGATIVE
Nitrite, UA: NEGATIVE
Protein, UA: NEGATIVE
Spec Grav, UA: 1.015 (ref 1.010–1.025)
Urobilinogen, UA: 0.2 E.U./dL
pH, UA: 6.5 (ref 5.0–8.0)

## 2021-05-16 MED ORDER — BACLOFEN 10 MG PO TABS: 10.0000 mg | ORAL_TABLET | Freq: Three times a day (TID) | ORAL | 1 refills | Status: AC

## 2021-05-16 MED ORDER — PREDNISONE 20 MG PO TABS: 20.0000 mg | ORAL_TABLET | Freq: Every day | ORAL | 0 refills | Status: AC

## 2021-05-16 MED ORDER — MELOXICAM 15 MG PO TABS: 15.0000 mg | ORAL_TABLET | Freq: Every day | ORAL | 1 refills | Status: AC

## 2021-05-16 NOTE — Progress Notes (Signed)
Established patient visit   Patient: Brandi Guzman   DOB: 1961/02/20   60 y.o. Female  MRN: [id number] Visit Date: 05/16/2021  Today's healthcare provider: Gwyneth Sprout, FNP   Chief Complaint  Patient presents with   Generalized Body Aches   Subjective    Muscle Pain This is a new problem. The current episode started more than 1 month ago. The problem occurs constantly. The problem has been gradually worsening since onset. The context of the pain is unknown. Pain location: arms, neck, upper and lower legs. The pain is severe. The symptoms are aggravated by inactivity. Associated symptoms include fatigue, joint swelling, stiffness and weakness. (numbness) Past treatments include OTC NSAID. The treatment provided mild relief. There is no swelling present. She has been Sleeping poorly (due to pain).     Medications: Outpatient Medications Prior to Visit  Medication Sig   BIOTIN PO Take by mouth.   ELDERBERRY PO Take by mouth.   MULTIPLE VITAMIN PO Take 1 tablet by mouth daily.   Probiotic Product (PROBIOTIC DAILY PO) Take 1 capsule by mouth daily.   [DISCONTINUED] Naproxen Sodium (ALEVE PO) Take by mouth.   cephALEXin (KEFLEX) 500 MG capsule Take 1 capsule (500 mg total) by mouth 2 (two) times daily. (Patient not taking: No sig reported)   No facility-administered medications prior to visit.    Review of Systems  Constitutional:  Positive for fatigue.  Musculoskeletal:  Positive for joint swelling and stiffness.  Neurological:  Positive for weakness.      Objective    BP 140/80 (BP Location: Left Arm, Patient Position: Sitting, Cuff Size: Normal)   Pulse 87   Temp 97.8 F (36.6 C) (Temporal)   Resp 16   Wt 117 lb 12.8 oz (53.4 kg)   SpO2 99%   BMI 20.22 kg/m  {Show previous vital signs (optional):23777}  Physical Exam Vitals and nursing note reviewed.  Constitutional:      General: She is not in acute distress.    Appearance: Normal appearance. She is  normal weight. She is not ill-appearing, toxic-appearing or diaphoretic.  HENT:     Head: Normocephalic and atraumatic.  Cardiovascular:     Rate and Rhythm: Normal rate and regular rhythm.     Pulses: Normal pulses.     Heart sounds: Normal heart sounds. No murmur heard.   No friction rub. No gallop.  Pulmonary:     Effort: Pulmonary effort is normal. No respiratory distress.     Breath sounds: Normal breath sounds. No stridor. No wheezing, rhonchi or rales.  Chest:     Chest wall: No tenderness.  Abdominal:     General: Bowel sounds are normal.     Palpations: Abdomen is soft.  Musculoskeletal:        General: Tenderness present. No swelling, deformity or signs of injury. Normal range of motion.     Cervical back: Tenderness present.     Right lower leg: No edema.     Left lower leg: No edema.  Skin:    General: Skin is warm and dry.     Capillary Refill: Capillary refill takes less than 2 seconds.     Coloration: Skin is not jaundiced or pale.     Findings: No bruising, erythema, lesion or rash.  Neurological:     General: No focal deficit present.     Mental Status: She is alert and oriented to person, place, and time. Mental status is at baseline.  Cranial Nerves: No cranial nerve deficit.     Sensory: No sensory deficit.     Motor: No weakness.     Coordination: Coordination normal.     Gait: Gait abnormal.     Comments: Guarded gait  Psychiatric:        Mood and Affect: Mood normal.        Behavior: Behavior normal.        Thought Content: Thought content normal.        Judgment: Judgment normal.     Results for orders placed or performed in visit on 05/16/21  POCT urinalysis dipstick  Result Value Ref Range   Color, UA yellow    Clarity, UA clear    Glucose, UA Negative Negative   Bilirubin, UA Negative    Ketones, UA Negative    Spec Grav, UA 1.015 1.010 - 1.025   Blood, UA Negative    pH, UA 6.5 5.0 - 8.0   Protein, UA Negative Negative    Urobilinogen, UA 0.2 0.2 or 1.0 E.U./dL   Nitrite, UA Negative    Leukocytes, UA Negative Negative    Assessment & Plan     Problem List Items Addressed This Visit       Other   Fatigue    Poor sleep -nerves -generalized complaints -pain in joints      Relevant Medications   meloxicam (MOBIC) 15 MG tablet   baclofen (LIORESAL) 10 MG tablet   predniSONE (DELTASONE) 20 MG tablet   Other Relevant Orders   C-reactive protein   CK Total (and CKMB)   CBC with Differential/Platelet   Comprehensive metabolic panel   EKG 56-EPPI (Completed)   T4, free   TSH   POCT urinalysis dipstick (Completed)   Generalized muscle weakness - Primary    Hips, calves, shoulders, neck, arms, hands      Relevant Medications   meloxicam (MOBIC) 15 MG tablet   baclofen (LIORESAL) 10 MG tablet   predniSONE (DELTASONE) 20 MG tablet   Other Relevant Orders   C-reactive protein   CK Total (and CKMB)   CBC with Differential/Platelet   Comprehensive metabolic panel   Sed Rate (ESR)   POCT urinalysis dipstick (Completed)   Pain of both hip joints    Initial location of onset of symptoms Unable to get up off soft surface without assistance      Relevant Orders   C-reactive protein   CK Total (and CKMB)   CBC with Differential/Platelet   Comprehensive metabolic panel   Sed Rate (ESR)    Return if symptoms worsen or fail to improve.      Vonna Kotyk, FNP, have reviewed all documentation for this visit. The documentation on 05/16/21 for the exam, diagnosis, procedures, and orders are all accurate and complete.    Gwyneth Sprout, [place] [phone] (phone) [phone] (fax)  [place]

## 2021-05-16 NOTE — Assessment & Plan Note (Signed)
Hips, calves, shoulders, neck, arms, hands

## 2021-05-16 NOTE — Assessment & Plan Note (Signed)
Initial location of onset of symptoms Unable to get up off soft surface without assistance

## 2021-05-16 NOTE — Assessment & Plan Note (Signed)
Poor sleep -nerves -generalized complaints -pain in joints

## 2021-05-17 LAB — CBC WITH DIFFERENTIAL/PLATELET
Basophils Absolute: 0 10*3/uL (ref 0.0–0.2)
Basos: 0 %
EOS (ABSOLUTE): 0.3 10*3/uL (ref 0.0–0.4)
Eos: 4 %
Hematocrit: 38.8 % (ref 34.0–46.6)
Hemoglobin: 12.3 g/dL (ref 11.1–15.9)
Immature Grans (Abs): 0 10*3/uL (ref 0.0–0.1)
Immature Granulocytes: 0 %
Lymphocytes Absolute: 2.9 10*3/uL (ref 0.7–3.1)
Lymphs: 35 %
MCH: 27.5 pg (ref 26.6–33.0)
MCHC: 31.7 g/dL (ref 31.5–35.7)
MCV: 87 fL (ref 79–97)
Monocytes Absolute: 0.8 10*3/uL (ref 0.1–0.9)
Monocytes: 10 %
Neutrophils Absolute: 4.3 10*3/uL (ref 1.4–7.0)
Neutrophils: 51 %
Platelets: 301 10*3/uL (ref 150–450)
RBC: 4.48 x10E6/uL (ref 3.77–5.28)
RDW: 11.7 % (ref 11.7–15.4)
WBC: 8.4 10*3/uL (ref 3.4–10.8)

## 2021-05-17 LAB — C-REACTIVE PROTEIN: CRP: 45 mg/L — ABNORMAL HIGH (ref 0–10)

## 2021-05-17 LAB — COMPREHENSIVE METABOLIC PANEL
ALT: 25 IU/L (ref 0–32)
AST: 13 IU/L (ref 0–40)
Albumin/Globulin Ratio: 1.3 (ref 1.2–2.2)
Albumin: 3.9 g/dL (ref 3.8–4.9)
Alkaline Phosphatase: 143 IU/L — ABNORMAL HIGH (ref 44–121)
BUN/Creatinine Ratio: 34 — ABNORMAL HIGH (ref 9–23)
BUN: 21 mg/dL (ref 6–24)
Bilirubin Total: <0.2 mg/dL (ref 0.0–1.2)
CO2: 24 mmol/L (ref 20–29)
Calcium: 9.7 mg/dL (ref 8.7–10.2)
Chloride: 100 mmol/L (ref 96–106)
Creatinine, Ser: 0.62 mg/dL (ref 0.57–1.00)
Globulin, Total: 2.9 g/dL (ref 1.5–4.5)
Glucose: 89 mg/dL (ref 70–99)
Potassium: 5 mmol/L (ref 3.5–5.2)
Sodium: 140 mmol/L (ref 134–144)
Total Protein: 6.8 g/dL (ref 6.0–8.5)
eGFR: 103 mL/min/{1.73_m2} (ref >59)

## 2021-05-17 LAB — T4, FREE: Free T4: 1.16 ng/dL (ref 0.82–1.77)

## 2021-05-17 LAB — TSH: TSH: 1.89 u[IU]/mL (ref 0.450–4.500)

## 2021-05-17 LAB — SEDIMENTATION RATE: Sed Rate: 30 mm/hr (ref 0–40)

## 2021-05-17 LAB — CK TOTAL AND CKMB (NOT AT ARMC)
CK-MB Index: 1.5 ng/mL (ref 0.0–5.3)
Total CK: 33 U/L (ref 32–182)

## 2021-05-19 ENCOUNTER — Encounter: Payer: Self-pay | Admitting: Family Medicine

## 2021-05-19 ENCOUNTER — Other Ambulatory Visit: Payer: Self-pay | Admitting: Family Medicine

## 2021-05-19 DIAGNOSIS — R7982 Elevated C-reactive protein (CRP): Secondary | ICD-10-CM

## 2021-05-19 DIAGNOSIS — M7918 Myalgia, other site: Secondary | ICD-10-CM

## 2021-05-20 ENCOUNTER — Telehealth: Payer: Self-pay

## 2021-05-20 DIAGNOSIS — R7982 Elevated C-reactive protein (CRP): Secondary | ICD-10-CM | POA: Diagnosis not present

## 2021-05-20 DIAGNOSIS — M7918 Myalgia, other site: Secondary | ICD-10-CM | POA: Diagnosis not present

## 2021-05-20 NOTE — Telephone Encounter (Signed)
Left message on patient's voicemail. She doesn't need an appointment fore labs and left the labs hours for her. If patient calls back [facility] for [facility] nurse to give her the message.  No need for an appointment to come to the lab. Our lab hours are 8 am to 5 pm. Closed for Lunch 12 pm to 1 pm.

## 2021-05-21 ENCOUNTER — Encounter: Payer: Self-pay | Admitting: Family Medicine

## 2021-05-22 DIAGNOSIS — R200 Anesthesia of skin: Secondary | ICD-10-CM | POA: Diagnosis not present

## 2021-05-22 DIAGNOSIS — M130 Polyarthritis, unspecified: Secondary | ICD-10-CM | POA: Diagnosis not present

## 2021-05-22 DIAGNOSIS — R531 Weakness: Secondary | ICD-10-CM | POA: Diagnosis not present

## 2021-05-22 DIAGNOSIS — R5383 Other fatigue: Secondary | ICD-10-CM | POA: Diagnosis not present

## 2021-05-22 DIAGNOSIS — R202 Paresthesia of skin: Secondary | ICD-10-CM | POA: Diagnosis not present

## 2021-05-22 DIAGNOSIS — R6883 Chills (without fever): Secondary | ICD-10-CM | POA: Diagnosis not present

## 2021-05-22 DIAGNOSIS — M791 Myalgia, unspecified site: Secondary | ICD-10-CM | POA: Diagnosis not present

## 2021-05-22 DIAGNOSIS — M2550 Pain in unspecified joint: Secondary | ICD-10-CM | POA: Diagnosis not present

## 2021-05-26 LAB — RHEUMATOID FACTOR: Rheumatoid fact SerPl-aCnc: <10 IU/mL (ref <14.0)

## 2021-05-26 LAB — ANTI-CCP AB, IGG + IGA (RDL): Anti-CCP Ab, IgG + IgA (RDL): <20 Units (ref <20)

## 2021-06-04 ENCOUNTER — Other Ambulatory Visit: Payer: Self-pay | Admitting: Family Medicine

## 2021-06-04 ENCOUNTER — Encounter: Payer: Self-pay | Admitting: Family Medicine

## 2021-06-04 DIAGNOSIS — A6923 Arthritis due to Lyme disease: Secondary | ICD-10-CM

## 2021-06-04 MED ORDER — DOXYCYCLINE HYCLATE 100 MG PO CAPS: 100.0000 mg | ORAL_CAPSULE | Freq: Two times a day (BID) | ORAL | 0 refills | Status: DC

## 2021-06-11 ENCOUNTER — Ambulatory Visit: Payer: Self-pay | Admitting: *Deleted

## 2021-06-11 ENCOUNTER — Other Ambulatory Visit: Payer: Self-pay | Admitting: Family Medicine

## 2021-06-11 NOTE — Telephone Encounter (Signed)
Called and spoke with patient; feels that she can manage with OTC medication and does not need antiviral at this time.  Further reassurance provided.

## 2021-06-11 NOTE — Telephone Encounter (Signed)
Reason for Disposition . [1] HIGH RISK for severe COVID complications (e.g., weak immune system, age > 64 years, obesity with BMI > 25, pregnant, chronic lung disease or other chronic medical condition) AND [2] COVID symptoms (e.g., cough, fever)  (Exceptions: Already seen by PCP and no new or worsening symptoms.)    Has Lyme Disease.  Answer Assessment - Initial Assessment Questions 1. COVID-19 DIAGNOSIS: "Who made your COVID-19 diagnosis?" "Was it confirmed by a positive lab test or self-test?" If not diagnosed by a doctor (or NP/PA), ask "Are there lots of cases (community spread) where you live?" Note: See public health department web site, if unsure.     I returned pt's call.   Her and her husband tested positive for Covid I had fever 101, chills, body aches 2. COVID-19 EXPOSURE: "Was there any known exposure to COVID before the symptoms began?" CDC Definition of close contact: within 6 feet (2 meters) for a total of 15 minutes or more over a 24-hour period.      Don't know 3. ONSET: "When did the COVID-19 symptoms start?"      Yesterday the chills hit me.   I have Lyme Disease.   I could not get warm.   I'm on an antibiotic for Lyme Doxycyline.   4. WORST SYMPTOM: "What is your worst symptom?" (e.g., cough, fever, shortness of breath, muscle aches)     Chills and fever.   My checks are flushed.   I'm taking Aleve. 5. COUGH: "Do you have a cough?" If Yes, ask: "How bad is the cough?"       A little coughing.   A little mucus comes up.   It's an eutral color. 6. FEVER: "Do you have a fever?" If Yes, ask: "What is your temperature, how was it measured, and when did it start?"     100 this morning but I'm taking Aleve.   During the night I got real hot and my temperature.    7. RESPIRATORY STATUS: "Describe your breathing?" (e.g., shortness of breath, wheezing, unable to speak)      No wheezing or shortness of breath 8. BETTER-SAME-WORSE: "Are you getting better, staying the same or getting  worse compared to yesterday?"  If getting worse, ask, "In what way?"     I feel better today. 9. HIGH RISK DISEASE: "Do you have any chronic medical problems?" (e.g., asthma, heart or lung disease, weak immune system, obesity, etc.)     I have Lyme disease.    No lung or heart problems.   I'm very healthy. 10. VACCINE: "Have you had the COVID-19 vaccine?" If Yes, ask: "Which one, how many shots, when did you get it?"       I had 2 doses of Pfizer. 11. BOOSTER: "Have you received your COVID-19 booster?" If Yes, ask: "Which one and when did you get it?"       No 12. PREGNANCY: "Is there any chance you are pregnant?" "When was your last menstrual period?"       N/A due to age 51. OTHER SYMPTOMS: "Do you have any other symptoms?"  (e.g., chills, fatigue, headache, loss of smell or taste, muscle pain, sore throat)       A dull headache but the Aleve is helping that. 14. PO2 SATURATION MONITOR:  "Do you use an oxygen saturation monitor (pulse oximeter) at home?" If Yes, ask "What is your reading (oxygen level) today?" "What is your usual oxygen saturation reading?" (e.g., 95%)  No  Protocols used: Coronavirus (MMEEG-56) Diagnosed or Suspected-A-AH

## 2021-06-11 NOTE — Telephone Encounter (Signed)
Please see triage note below. KW

## 2021-06-11 NOTE — Telephone Encounter (Signed)
I returned pt's call.  She had called in earlier because she and her husband tested positive for Covid last night with home tests.   She started having symptoms yesterday with chills and fever and a slight cough.   See triage notes.    She didn't know if she should take an antiviral or not.   She prefers not to take the antiviral due to side effects but she has Lyme Disease and is on Doxycycline for it.   She is feeling better today than yesterday.   No high risk factors like heart disease, lung problems or diabetes.   "I'm very healthy other than having Lyme Disease".     She mentioned her husband is positive for Covid too.   He does not have any underlying health factors either.   He is concerned because he take care of his elderly parents.  I went over the care advice with her, quarantine, OTC medications for symptom management and when to get medical attention at the ED or urgent care over the Thanksgiving holiday should her symptoms develop into shortness of breath or chest tightness.   She verbalized understanding.    I let her know I'm sending her information to Merita Norton, FNP for review and someone would call her back with her recommendations.  Pt was agreeable to this plan.

## 2021-06-24 DIAGNOSIS — Z7960 Long term (current) use of unspecified immunomodulators and immunosuppressants: Secondary | ICD-10-CM | POA: Diagnosis not present

## 2021-06-24 DIAGNOSIS — M353 Polymyalgia rheumatica: Secondary | ICD-10-CM | POA: Diagnosis not present

## 2021-07-16 ENCOUNTER — Telehealth: Payer: BC Managed Care – PPO | Admitting: Family

## 2021-07-16 DIAGNOSIS — R051 Acute cough: Secondary | ICD-10-CM

## 2021-07-16 MED ORDER — DOXYCYCLINE HYCLATE 100 MG PO TABS: 100.0000 mg | ORAL_TABLET | Freq: Two times a day (BID) | ORAL | 0 refills | Status: AC

## 2021-07-16 MED ORDER — BENZONATATE 100 MG PO CAPS: 100.0000 mg | ORAL_CAPSULE | Freq: Three times a day (TID) | ORAL | 0 refills | Status: AC | PRN

## 2021-07-16 NOTE — Progress Notes (Signed)

## 2021-08-08 DIAGNOSIS — Z79899 Other long term (current) drug therapy: Secondary | ICD-10-CM | POA: Diagnosis not present

## 2021-08-08 DIAGNOSIS — M353 Polymyalgia rheumatica: Secondary | ICD-10-CM | POA: Diagnosis not present

## 2021-09-19 DIAGNOSIS — Z7952 Long term (current) use of systemic steroids: Secondary | ICD-10-CM | POA: Diagnosis not present

## 2021-09-19 DIAGNOSIS — M353 Polymyalgia rheumatica: Secondary | ICD-10-CM | POA: Diagnosis not present

## 2021-11-21 DIAGNOSIS — Z7960 Long term (current) use of unspecified immunomodulators and immunosuppressants: Secondary | ICD-10-CM | POA: Diagnosis not present

## 2021-11-21 DIAGNOSIS — M353 Polymyalgia rheumatica: Secondary | ICD-10-CM | POA: Diagnosis not present
